# Patient Record
Sex: Female | Born: 1995 | Race: White | Hispanic: No | Marital: Single | Smoking: Never smoker
Health system: Southern US, Community
[De-identification: ages and names within clinical notes are randomized; demographics above are authoritative.]

## PROBLEM LIST (undated history)

## (undated) DIAGNOSIS — D3910 Neoplasm of uncertain behavior of unspecified ovary: Secondary | ICD-10-CM

## (undated) DIAGNOSIS — N800 Endometriosis of uterus: Secondary | ICD-10-CM

## (undated) DIAGNOSIS — N946 Dysmenorrhea, unspecified: Secondary | ICD-10-CM

## (undated) DIAGNOSIS — A6000 Herpesviral infection of urogenital system, unspecified: Secondary | ICD-10-CM

## (undated) DIAGNOSIS — F32A Depression, unspecified: Secondary | ICD-10-CM

## (undated) DIAGNOSIS — R519 Headache, unspecified: Secondary | ICD-10-CM

## (undated) DIAGNOSIS — N8003 Adenomyosis of the uterus: Secondary | ICD-10-CM

## (undated) DIAGNOSIS — K589 Irritable bowel syndrome without diarrhea: Secondary | ICD-10-CM

## (undated) HISTORY — DX: Irritable bowel syndrome, unspecified: K58.9

## (undated) HISTORY — DX: Endometriosis of uterus: N80.0

## (undated) HISTORY — PX: GUM SURGERY: SHX658

## (undated) HISTORY — DX: Depression, unspecified: F32.A

## (undated) HISTORY — DX: Dysmenorrhea, unspecified: N94.6

## (undated) HISTORY — DX: Adenomyosis of the uterus: N80.03

---

## 2014-06-06 DIAGNOSIS — N944 Primary dysmenorrhea: Secondary | ICD-10-CM | POA: Insufficient documentation

## 2017-09-24 DIAGNOSIS — K580 Irritable bowel syndrome with diarrhea: Secondary | ICD-10-CM | POA: Insufficient documentation

## 2019-05-23 DIAGNOSIS — G8929 Other chronic pain: Secondary | ICD-10-CM | POA: Insufficient documentation

## 2019-05-23 DIAGNOSIS — F339 Major depressive disorder, recurrent, unspecified: Secondary | ICD-10-CM | POA: Insufficient documentation

## 2019-05-23 DIAGNOSIS — R102 Pelvic and perineal pain: Secondary | ICD-10-CM | POA: Insufficient documentation

## 2019-07-12 DIAGNOSIS — M7918 Myalgia, other site: Secondary | ICD-10-CM | POA: Insufficient documentation

## 2019-11-15 HISTORY — PX: LAPAROSCOPIC ENDOMETRIOSIS FULGURATION: SUR769

## 2020-02-12 HISTORY — PX: LAPAROSCOPY: SHX197

## 2020-07-28 DIAGNOSIS — C569 Malignant neoplasm of unspecified ovary: Secondary | ICD-10-CM | POA: Insufficient documentation

## 2021-05-31 ENCOUNTER — Other Ambulatory Visit: Payer: Self-pay

## 2021-05-31 ENCOUNTER — Telehealth: Payer: Self-pay

## 2021-05-31 ENCOUNTER — Other Ambulatory Visit: Payer: Self-pay | Admitting: Gynecologic Oncology

## 2021-05-31 DIAGNOSIS — C569 Malignant neoplasm of unspecified ovary: Secondary | ICD-10-CM

## 2021-05-31 NOTE — Telephone Encounter (Signed)
[facility] is not in network for new insurance [employer]. Ms Krista Mcintyre is going to e-mail insurance card info to [facility] this evening. The email will be checked in the am as prior auth is needed for [place] scheduled on 06-19-21 at 1330. CA-125 to be drawn on 06-19-21 as well for visit 06-26-21 with Dr. Berline Lopes.

## 2021-05-31 NOTE — Progress Notes (Signed)
See Care Everywhere for the last office visit with Dr. Alycia Rossetti. Plan is for [place] and CA 125 checks every six months.

## 2021-06-01 ENCOUNTER — Telehealth: Payer: Self-pay

## 2021-06-01 NOTE — Telephone Encounter (Signed)
Left message for patient letting her know that her insurance information has been received and saved to her chart. Instructed to call with any questions.

## 2021-06-19 ENCOUNTER — Other Ambulatory Visit: Payer: Self-pay

## 2021-06-19 ENCOUNTER — Inpatient Hospital Stay: Payer: 59 | Attending: Gynecologic Oncology

## 2021-06-19 ENCOUNTER — Ambulatory Visit (HOSPITAL_COMMUNITY)
Admission: RE | Admit: 2021-06-19 | Discharge: 2021-06-19 | Disposition: A | Discharge status: Home / Self Care | Payer: 59 | Source: Ambulatory Visit | Attending: Gynecologic Oncology | Admitting: Gynecologic Oncology

## 2021-06-19 DIAGNOSIS — R102 Pelvic and perineal pain: Secondary | ICD-10-CM | POA: Insufficient documentation

## 2021-06-19 DIAGNOSIS — N800 Endometriosis of uterus: Secondary | ICD-10-CM | POA: Insufficient documentation

## 2021-06-19 DIAGNOSIS — C569 Malignant neoplasm of unspecified ovary: Secondary | ICD-10-CM | POA: Insufficient documentation

## 2021-06-19 DIAGNOSIS — R110 Nausea: Secondary | ICD-10-CM | POA: Insufficient documentation

## 2021-06-19 DIAGNOSIS — D3910 Neoplasm of uncertain behavior of unspecified ovary: Secondary | ICD-10-CM | POA: Insufficient documentation

## 2021-06-20 LAB — CA 125: Cancer Antigen (CA) 125: 15.6 U/mL (ref 0.0–38.1)

## 2021-06-25 ENCOUNTER — Encounter: Payer: Self-pay | Admitting: Gynecologic Oncology

## 2021-06-25 DIAGNOSIS — N8003 Adenomyosis of the uterus: Secondary | ICD-10-CM | POA: Insufficient documentation

## 2021-06-25 DIAGNOSIS — N800 Endometriosis of uterus: Secondary | ICD-10-CM | POA: Insufficient documentation

## 2021-06-25 NOTE — Progress Notes (Signed)
GYNECOLOGIC ONCOLOGY NEW PATIENT CONSULTATION   Patient Name: Krista Mcintyre  Patient Age: 25 y.o. Date of Service: 06/26/21 Referring Provider: No referring provider defined for this encounter.   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   The patient and I reviewed prior pathology details from her surgery and diagnosis of serous borderline tumors. She has been previously counseled about surveillance recommendations in the setting of borderline tumors although this testing (ultrasound and CA-125) is not particularly sensitive or specific. She had a recent pelvic ultrasound without significant/abnormal findings. CA-125 was 15 (did not have this drawn around time of initial diagnosis, first level drawn was at [facility] visit last fall). I recommend continued surveillance visits every 6 months to include symptom review and pelvic exam. We also discussed recommendations in terms of definitive surgery usually recommended after child bearing is complete and to be considered starting between ages of 99-40.  In terms of her pelvic pain, I suspect these are related to adenomyosis (findings suspicious for this diagnosis on previous MRI). We discussed that this diagnosis can only be definitely established on pathologic review after hysterectomy. She was previously managed by an adenomyosis specialist. Given significant symptoms, even on medical management, the patient has previously voiced interest in hysterectomy. It is clear to me that she has spent a lot of time thinking about her fertility plans. We discussed risk of surgery at a young age (in terms of regret), but I feel that it would be reasonable to offer definitive surgery with total hysterectomy in the near future. Plan would be to leave ovaries in situ as  long as normal in appearance. We will address this at her visit in 6 months and likely plan for surgery within the upcoming year.   I will see her in 6 months with a ultrasound and CA-125 on the same day give her 2.5 hour drive.   65 minutes of total time was spent for this patient encounter, including preparation, face-to-face counseling with the patient and coordination of care, and documentation of the encounter.  Krista Pinch, MD  Division of Gynecologic Oncology  Department of Obstetrics and Gynecology  [facility]  ___________________________________________  Chief Complaint: Chief Complaint  Patient presents with   Serous cystadenoma, borderline malignancy, unspecified late    History of Present Illness:  Krista Mcintyre is a 25 y.o. y.o. female who is seen in consultation at the request of No ref. 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  Her borderline tumor is noted below: Early 2021:  initially seen by the [facility] service at [facility] [place] for chronic pelvic pain. Management with continuous OCP was unhelpful as was duloxetine. Endometriosis was suspected and she was consented for laparoscopic fulguration and IUD placement.  12/03/2019 underwent operative laparoscopy for excision of endometriosis and placement of mIUD with MIS, biopsy of adhesion and pelvic side wall implant. Findings from surgery - "3cm area over the right pelvic sidewall with a vesicular plaque, visibly suspicious for endometriosis, just medial to the right ureter. 1cm area of similar appearing plaque over the left ureter. Bilateral ureters visualized retroperitoneally and lateralized from the operative sites. Otherwise normal posterior cul de sac. All visible disease excised."  Final path returned serous borderline tumor.  Referred to gyn  oncology. 01/11/2020 case discussed in tumor  board recommendation with CT abdomen pelvis and diagnostic laparoscopy with washing and peritoneal biopsy with omentectomy was recommended. 01/19/2020 CT scan revealed no visible disease or enlarged lymph nodes. 02/2020: Reoperation with Dr. Cecil Mcintyre ([place]) for pelvic washings, omentectomy, and peritoneal stripping with biopsies. Cytology was positive for neoplastic cells but all other biopsies were negative.  04/2020: seen at [facility] for follow-up. Moved to [place], [place] 07/2020: Established care at [facility]. CA-125 was 8.  The patient reports intermittent (not daily) abdominal and pelvic pain that worsened this summer and has become more frequent. At times this pain is rather debilitating and makes it difficult for her to do daily activities. She describes it was stabbing pain that comes and goes, located in her central deep pelvis with occasional radiation to her left hip and down her left leg and sometimes is associated with low back pain. She uses Advil and acetaminophen prn for pain which she is not sure provides any relief. Heat therapy helps some as does Teacher, adult education. She is amenorheic with the Mirena IUD and on OCPs. She denies any early satiety or bloating. She has occasional nausea when she has pelvic pain (ave 1x week - has anti-emetic that she can use when she gets symptoms). She endorses a good appetite, denies emesis. Has some pain with bowel movements (occasional), denies any bleeding. Bowel function is otherwise normal. She denies urinary symptoms. Has done pelvic floor PT in the past, which she enjoyed but did not find very helpful in terms of symptoms.  The patient lives alone. She works at [employer].  She is not interested in future fertility. She has been thinking about hysterectomy since the age of 48. She is single and not interested in long-term partnership.  PAST MEDICAL HISTORY:  Past Medical History:  Diagnosis Date   Adenomyosis    Depression    Dysmenorrhea    IBS  (irritable bowel syndrome)      PAST SURGICAL HISTORY:  Past Surgical History:  Procedure Laterality Date   LAPAROSCOPIC ENDOMETRIOSIS FULGURATION  11/2019   LAPAROSCOPY  02/2020   omentectomy, peritoneal biopsies    OB/GYN HISTORY:  OB History  Gravida Para Term Preterm AB Living  0 0 0 0 0 0  SAB IAB Ectopic Multiple Live Births  0 0 0 0 0    No LMP recorded.  Age at menarche: 89  Age at menopause: n/a Hx of HRT: n/a Hx of STDs: Denies Last pap: 09/2019 - negative History of abnormal pap smears: Denies Birth control history: OCP x 6 years, Mirena + COC at present  MEDICATIONS: Outpatient Encounter Medications as of 06/26/2021  Medication Sig   buPROPion (WELLBUTRIN XL) 150 MG 24 hr tablet Take 1 tablet by mouth every morning.   Drospirenone-Ethinyl Estradiol-Levomefol (SAFYRAL) 3-0.03-0.451 MG tablet Take by mouth. 1 Tablet by mouth as directed. Patient to take active pills only. Patient needs 63 day supply.   nortriptyline (PAMELOR) 10 MG capsule Take 10 mg by mouth at bedtime.   ondansetron (ZOFRAN-ODT) 4 MG disintegrating tablet Take 1 tablet by mouth every 8 (eight) hours as needed.   amoxicillin (AMOXIL) 875 MG tablet Take 1 tablet by mouth in the morning and at bedtime. (Patient not taking: Reported on 06/26/2021)   HYDROcodone-acetaminophen (NORCO/VICODIN) 5-325 MG tablet Take 1 tablet by mouth every 6 (six) hours as needed. (Patient not taking: Reported on 06/26/2021)   predniSONE (DELTASONE) 10 MG tablet Take 10 mg by mouth  daily as needed. (Patient not taking: Reported on 06/26/2021)   [DISCONTINUED] Drospirenone-Ethinyl Estradiol-Levomefol (SAFYRAL) 3-0.03-0.451 MG tablet Take 1 tablet by mouth as directed.   No facility-administered encounter medications on file as of 06/26/2021.    ALLERGIES:  No Known Allergies   FAMILY HISTORY:  Family History  Problem Relation Age of Onset   Breast cancer Maternal Grandmother    Kidney cancer Maternal Grandfather     Cervical cancer Paternal Grandmother      SOCIAL HISTORY:  Social Connections: Not on file    REVIEW OF SYSTEMS:  Pertinent positives as per HPI Denies appetite changes, fevers, chills, fatigue, unexplained weight changes. Denies hearing loss, neck lumps or masses, mouth sores, ringing in ears or voice changes. Denies cough or wheezing.  Denies shortness of breath. Denies chest pain or palpitations. Denies leg swelling. Denies abdominal distention, pain, blood in stools, constipation, diarrhea, vomiting, or early satiety. Denies pain with intercourse, dysuria, frequency, hematuria or incontinence. Denies hot flashes or vaginal discharge.   Denies joint pain, back pain or muscle pain/cramps. Denies itching, rash, or wounds. Denies dizziness, headaches, numbness or seizures. Denies swollen lymph nodes or glands, denies easy bruising or bleeding. Denies anxiety, depression, confusion, or decreased concentration.  Physical Exam:  Vital Signs for this encounter:  Blood pressure 119/72, pulse 100, temperature (!) 97.2 F (36.2 C), resp. rate 18, weight 129 lb 3.2 oz (58.6 kg), SpO2 99 %. There is no height or weight on file to calculate BMI. General: Alert, oriented, no acute distress.  HEENT: Normocephalic, atraumatic. Sclera anicteric.  Chest: Clear to auscultation bilaterally. No wheezes, rhonchi, or rales. Cardiovascular: Regular rate and rhythm, no murmurs, rubs, or gallops.  Abdomen: Normoactive bowel sounds. Soft, nondistended, nontender to palpation. No masses or hepatosplenomegaly appreciated. No palpable fluid wave. Well healed incisions. Extremities: Grossly normal range of motion. Warm, well perfused. No edema bilaterally.  Skin: No rashes or lesions.  Lymphatics: No cervical, supraclavicular, or inguinal adenopathy.  GU:  Normal external female genitalia.  No lesions. No discharge or bleeding.             Bladder/urethra:  No lesions or masses, well supported bladder              Vagina: Well rugated, no lesions or masses.             Cervix: Normal appearing, no lesions. IUD strings visualized.             Uterus: Small, mobile, no parametrial involvement or nodularity.             Adnexa: No masses appreciated.  Rectal: No nodularity or masses.  LABORATORY AND RADIOLOGIC DATA:  Outside medical records were reviewed to synthesize the above history, along with the history and physical obtained during the visit.   No results found for: WBC, HGB, HCT, PLT, GLUCOSE, CHOL, TRIG, HDL, LDLDIRECT, LDLCALC, ALT, AST, NA, K, CL, CREATININE, BUN, CO2, TSH, PSA, INR, GLUF, HGBA1C, MICROALBUR  Component 07/31/2020                       06/19/21     CA 125 8                                       15.6   Pelvic ultrasound 06/19/21: IMPRESSION: 1. Normal sonographic appearance of the ovaries. No adnexal mass or free  fluid. 2. Normal uterus and endometrium. 3. IUD in appropriate position within the endometrial cavity.

## 2021-06-26 ENCOUNTER — Other Ambulatory Visit: Payer: Self-pay

## 2021-06-26 ENCOUNTER — Encounter: Payer: Self-pay | Admitting: Gynecologic Oncology

## 2021-06-26 ENCOUNTER — Inpatient Hospital Stay (HOSPITAL_BASED_OUTPATIENT_CLINIC_OR_DEPARTMENT_OTHER): Payer: 59 | Admitting: Gynecologic Oncology

## 2021-06-26 VITALS — BP 119/72 | HR 100 | Temp 97.2°F | Resp 18 | Wt 129.2 lb

## 2021-06-26 DIAGNOSIS — D3910 Neoplasm of uncertain behavior of unspecified ovary: Secondary | ICD-10-CM | POA: Insufficient documentation

## 2021-06-26 DIAGNOSIS — N800 Endometriosis of uterus: Secondary | ICD-10-CM | POA: Diagnosis not present

## 2021-06-26 DIAGNOSIS — R102 Pelvic and perineal pain: Secondary | ICD-10-CM | POA: Diagnosis not present

## 2021-06-26 DIAGNOSIS — D484 Neoplasm of uncertain behavior of peritoneum: Secondary | ICD-10-CM | POA: Insufficient documentation

## 2021-06-26 DIAGNOSIS — N8003 Adenomyosis of the uterus: Secondary | ICD-10-CM

## 2021-06-26 DIAGNOSIS — R110 Nausea: Secondary | ICD-10-CM | POA: Diagnosis not present

## 2021-06-26 DIAGNOSIS — C569 Malignant neoplasm of unspecified ovary: Secondary | ICD-10-CM

## 2021-06-26 NOTE — Patient Instructions (Signed)
It was a pleasure meeting you today.  I do not see or feel anything on my exam that is concerning for recurrence of your borderline tumor.  I had like to see you back in 6 months and we will plan for CA125 and pelvic ultrasound on the same day with a visit.  If your symptoms worsen between now and then, please reach out.  Otherwise, we will plan to discuss definitive surgery with a hysterectomy at that visit.

## 2021-08-29 ENCOUNTER — Encounter: Payer: Self-pay | Admitting: Gynecologic Oncology

## 2021-09-04 ENCOUNTER — Telehealth: Payer: Self-pay | Admitting: *Deleted

## 2021-09-04 NOTE — Telephone Encounter (Signed)
Called and left the patient a message to call the office back to schedule an appt to discuss surgery in mid January

## 2021-09-17 NOTE — Telephone Encounter (Signed)
Spoke with the patient for a follow up with Dr Berline Lopes on 1/16

## 2021-10-23 ENCOUNTER — Encounter: Payer: Self-pay | Admitting: Gynecologic Oncology

## 2021-10-29 ENCOUNTER — Inpatient Hospital Stay: Payer: 59 | Attending: Gynecologic Oncology | Admitting: Gynecologic Oncology

## 2021-10-29 ENCOUNTER — Other Ambulatory Visit: Payer: Self-pay

## 2021-10-29 ENCOUNTER — Encounter: Payer: Self-pay | Admitting: Gynecologic Oncology

## 2021-10-29 VITALS — BP 126/72 | HR 105 | Temp 98.2°F | Resp 16 | Ht 64.57 in | Wt 133.4 lb

## 2021-10-29 DIAGNOSIS — R102 Pelvic and perineal pain: Secondary | ICD-10-CM | POA: Insufficient documentation

## 2021-10-29 DIAGNOSIS — N8003 Adenomyosis of the uterus: Secondary | ICD-10-CM | POA: Insufficient documentation

## 2021-10-29 DIAGNOSIS — N946 Dysmenorrhea, unspecified: Secondary | ICD-10-CM | POA: Insufficient documentation

## 2021-10-29 DIAGNOSIS — C569 Malignant neoplasm of unspecified ovary: Secondary | ICD-10-CM

## 2021-10-29 DIAGNOSIS — F32A Depression, unspecified: Secondary | ICD-10-CM | POA: Diagnosis not present

## 2021-10-29 DIAGNOSIS — Z79890 Hormone replacement therapy (postmenopausal): Secondary | ICD-10-CM | POA: Diagnosis not present

## 2021-10-29 DIAGNOSIS — Z79899 Other long term (current) drug therapy: Secondary | ICD-10-CM | POA: Insufficient documentation

## 2021-10-29 DIAGNOSIS — D3910 Neoplasm of uncertain behavior of unspecified ovary: Secondary | ICD-10-CM | POA: Diagnosis present

## 2021-10-29 DIAGNOSIS — K589 Irritable bowel syndrome without diarrhea: Secondary | ICD-10-CM | POA: Diagnosis not present

## 2021-10-29 NOTE — Patient Instructions (Addendum)
We will schedule surgery for December 26, 2021. Please let us know if we need to adjust the date after speaking with your family. We will have you come back to the office closer to the date to have a pre-op appointment with Keifer Habib NP to discuss the surgery in detail including instructions before and after. You may also receive a phone call from the hospital to arrange for a pre-op appointment there as well.

## 2021-10-29 NOTE — H&P (View-Only) (Signed)
Gynecologic Oncology Return Clinic Visit  10/29/21  Reason for Visit: surveillance visit in the setting of borderline tumor  Treatment History: Early 2021:  initially seen by the [facility] service at [facility] [place] for chronic pelvic pain. Management with continuous OCP was unhelpful as was duloxetine. Endometriosis was suspected and she was consented for laparoscopic fulguration and IUD placement.  12/03/2019 underwent operative laparoscopy for excision of endometriosis and placement of mIUD with MIS, biopsy of adhesion and pelvic side wall implant. Findings from surgery - "3cm area over the right pelvic sidewall with a vesicular plaque, visibly suspicious for endometriosis, just medial to the right ureter. 1cm area of similar appearing plaque over the left ureter. Bilateral ureters visualized retroperitoneally and lateralized from the operative sites. Otherwise normal posterior cul de sac. All visible disease excised."  Final path returned serous borderline tumor.  Referred to gyn oncology. 01/11/2020 case discussed in tumor board recommendation with CT abdomen pelvis and diagnostic laparoscopy with washing and peritoneal biopsy with omentectomy was recommended. 01/19/2020 CT scan revealed no visible disease or enlarged lymph nodes. 02/2020: Reoperation with Dr. Cecil Cranker ([place]) for pelvic washings, omentectomy, and peritoneal stripping with biopsies. Cytology was positive for neoplastic cells but all other biopsies were negative.  04/2020: seen at [facility] for follow-up. Moved to [place], [place] 07/2020: Established care at [facility]. CA-125 was 8.  Interval History: Patient presents today for follow-up.  She is overall doing well.  Notes some increase in pelvic pain, which is happening for at least 5 days a month now.  During this time, she is in bed without any significant relief.  She describes the pain as acute, stabbing, with radiation down her left leg and sometimes lower back pain.  She also has nausea now more  frequently, that seems to be related to episodes of pain.  She had 1 episode of emesis with nausea several weeks ago, otherwise denies emesis.  She denies any vaginal bleeding or discharge.  Reports normal bowel function.  Past Medical/Surgical History: Past Medical History:  Diagnosis Date   Adenomyosis    Depression    Dysmenorrhea    IBS (irritable bowel syndrome)     Past Surgical History:  Procedure Laterality Date   LAPAROSCOPIC ENDOMETRIOSIS FULGURATION  11/2019   LAPAROSCOPY  02/2020   omentectomy, peritoneal biopsies    Family History  Problem Relation Age of Onset   Breast cancer Maternal Grandmother    Kidney cancer Maternal Grandfather    Cervical cancer Paternal Grandmother     Social History   Socioeconomic History   Marital status: Single    Spouse name: Not on file   Number of children: Not on file   Years of education: Not on file   Highest education level: Not on file  Occupational History   Not on file  Tobacco Use   Smoking status: Never   Smokeless tobacco: Never  Vaping Use   Vaping Use: Never used  Substance and Sexual Activity   Alcohol use: Not on file   Drug use: Never   Sexual activity: Not on file  Other Topics Concern   Not on file  Social History Narrative   Not on file   Social Determinants of Health   Financial Resource Strain: Not on file  Food Insecurity: Not on file  Transportation Needs: Not on file  Physical Activity: Not on file  Stress: Not on file  Social Connections: Not on file    Current Medications:  Current Outpatient Medications:  buPROPion (WELLBUTRIN XL) 150 MG 24 hr tablet, Take 1 tablet by mouth every morning., Disp: , Rfl:    Cholecalciferol 125 MCG (5000 UT) TABS, Take 125 mcg by mouth daily., Disp: , Rfl:    Drospirenone-Ethinyl Estradiol-Levomefol (SAFYRAL) 3-0.03-0.451 MG tablet, Take by mouth. 1 Tablet by mouth as directed. Patient to take active pills only. Patient needs 63 day supply., Disp:  , Rfl:    nortriptyline (PAMELOR) 10 MG capsule, Take 10 mg by mouth at bedtime., Disp: , Rfl:    ondansetron (ZOFRAN-ODT) 4 MG disintegrating tablet, Take 1 tablet by mouth every 8 (eight) hours as needed., Disp: , Rfl:    valACYclovir (VALTREX) 500 MG tablet, Take 1 tablet by mouth daily., Disp: , Rfl:    HYDROcodone-acetaminophen (NORCO/VICODIN) 5-325 MG tablet, Take 1 tablet by mouth every 6 (six) hours as needed. (Patient not taking: Reported on 06/26/2021), Disp: , Rfl:    predniSONE (DELTASONE) 10 MG tablet, Take 10 mg by mouth daily as needed. (Patient not taking: Reported on 06/26/2021), Disp: , Rfl:   Review of Systems: Denies appetite changes, fevers, chills, fatigue, unexplained weight changes. Denies hearing loss, neck lumps or masses, mouth sores, ringing in ears or voice changes. Denies cough or wheezing.  Denies shortness of breath. Denies chest pain or palpitations. Denies leg swelling. Denies abdominal distention, pain, blood in stools, constipation, diarrhea, nausea, vomiting, or early satiety. Denies pain with intercourse, dysuria, frequency, hematuria or incontinence. Denies hot flashes, pelvic pain, vaginal bleeding or vaginal discharge.   Denies joint pain, back pain or muscle pain/cramps. Denies itching, rash, or wounds. Denies dizziness, headaches, numbness or seizures. Denies swollen lymph nodes or glands, denies easy bruising or bleeding. Denies anxiety, depression, confusion, or decreased concentration.  Physical Exam: BP 126/72 (BP Location: Left Arm, Patient Position: Sitting)    Pulse (!) 105    Temp 98.2 F (36.8 C) (Oral)    Resp 16    Ht 5' 4.57" (1.64 m)    Wt 133 lb 6.4 oz (60.5 kg)    SpO2 99%    BMI 22.50 kg/m  General: Alert, oriented, no acute distress. HEENT: Normocephalic, atraumatic, sclera anicteric. Chest: Unlabored breathing on room air.  Laboratory & Radiologic Studies: Pelvic ultrasound 06/19/21: 1. Normal sonographic appearance of the  ovaries. No adnexal mass or free fluid. 2. Normal uterus and endometrium. 3. IUD in appropriate position within the endometrial cavity.  06/19/21: Cancer Antigen (CA) 125 0.0 - 38.1 U/mL 15.6   07/21/20: CA 125 0 - 35 U/mL 8     Assessment & Plan: Krista Mcintyre is a 26 y.o. 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    The patient continues to have symptoms related to pelvic pain despite cessation of menses. She have previously discussed hysterectomy (as she has with other medical providers). She clearly expresses reasons that she is ready to move forward with definitive hysterectomy. We discussed risk factors for regret with sterilization procedures including younger age and being partnered.   She is wanting to move forward with total hysterectomy in the next 3-4 months. She has several weeks where her parents would be able to come help out around the time of surgery.   Plan will continue to be leaving ovaries in situ if they look normal but removal of bilateral tubes.   We will continue with CA-125, ultrasound, and exams every 6 months for surveillance until completion surgery (with removal of bilateral  ovaries) at a later age.   She will return closer to date of surgery for a pre-operative visit.  The risks of surgery were discussed in detail and she understands these to include infection; wound separation; hernia; vaginal cuff separation, injury to adjacent organs such as bowel, bladder, blood vessels, ureters and nerves; bleeding which may require blood transfusion; anesthesia risk; thromboembolic events; possible death; unforeseen complications; possible need for re-exploration; medical complications such as heart attack, stroke, pleural effusion and pneumonia; and, if full lymphadenectomy is performed the risk of lymphedema and lymphocyst. The patient will receive DVT and antibiotic prophylaxis  as indicated. She voiced a clear understanding. She had the opportunity to ask questions.   32 minutes of total time was spent for this patient encounter, including preparation, face-to-face counseling with the patient and coordination of care, and documentation of the encounter.  Jeral Pinch, MD  Division of Gynecologic Oncology  Department of Obstetrics and Gynecology  [facility]

## 2021-10-29 NOTE — Progress Notes (Signed)
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 Management with continuous OCP was unhelpful as was duloxetine. Endometriosis was suspected and she was consented for laparoscopic fulguration and IUD placement.  12/03/2019 underwent operative laparoscopy for excision of endometriosis and placement of mIUD with MIS, biopsy of adhesion and pelvic side wall implant. Findings from surgery - "3cm area over the right pelvic sidewall with a vesicular plaque, visibly suspicious for endometriosis, just medial to the right ureter. 1cm area of similar appearing plaque over the left ureter. Bilateral ureters visualized retroperitoneally and lateralized from the operative sites. Otherwise normal posterior cul de sac. All visible disease excised."  Final path returned serous borderline tumor.  Referred to gyn oncology. 01/11/2020 case discussed in tumor board recommendation with CT abdomen pelvis and diagnostic laparoscopy with washing and peritoneal biopsy with omentectomy was recommended. 01/19/2020 CT scan revealed no visible disease or enlarged lymph nodes. 02/2020: Reoperation with Dr. Cecil Cranker ([place]) for pelvic washings, omentectomy, and peritoneal stripping with biopsies. Cytology was positive for neoplastic cells but all other biopsies were negative.  04/2020: seen at [facility] for follow-up. Moved to [place], [place] 07/2020: Established care at [facility]. CA-125 was 8.  Interval History: Patient presents today for follow-up.  She is overall doing well.  Notes some increase in pelvic pain, which is happening for at least 5 days a month now.  During this time, she is in bed without any significant relief.  She describes the pain as acute, stabbing, with radiation down her left leg and sometimes lower back pain.  She also has nausea now more  frequently, that seems to be related to episodes of pain.  She had 1 episode of emesis with nausea several weeks ago, otherwise denies emesis.  She denies any vaginal bleeding or discharge.  Reports normal bowel function.  Past Medical/Surgical History: Past Medical History:  Diagnosis Date   Adenomyosis    Depression    Dysmenorrhea    IBS (irritable bowel syndrome)     Past Surgical History:  Procedure Laterality Date   LAPAROSCOPIC ENDOMETRIOSIS FULGURATION  11/2019   LAPAROSCOPY  02/2020   omentectomy, peritoneal biopsies    Family History  Problem Relation Age of Onset   Breast cancer Maternal Grandmother    Kidney cancer Maternal Grandfather    Cervical cancer Paternal Grandmother     Social History   Socioeconomic History   Marital status: Single    Spouse name: Not on file   Number of children: Not on file   Years of education: Not on file   Highest education level: Not on file  Occupational History   Not on file  Tobacco Use   Smoking status: Never   Smokeless tobacco: Never  Vaping Use   Vaping Use: Never used  Substance and Sexual Activity   Alcohol use: Not on file   Drug use: Never   Sexual activity: Not on file  Other Topics Concern   Not on file  Social History Narrative   Not on file   Social Determinants of Health   Financial Resource Strain: Not on file  Food Insecurity: Not on file  Transportation Needs: Not on file  Physical Activity: Not on file  Stress: Not on file  Social Connections: Not on file    Current Medications:  Current Outpatient Medications:  buPROPion (WELLBUTRIN XL) 150 MG 24 hr tablet, Take 1 tablet by mouth every morning., Disp: , Rfl:    Cholecalciferol 125 MCG (5000 UT) TABS, Take 125 mcg by mouth daily., Disp: , Rfl:    Drospirenone-Ethinyl Estradiol-Levomefol (SAFYRAL) 3-0.03-0.451 MG tablet, Take by mouth. 1 Tablet by mouth as directed. Patient to take active pills only. Patient needs 63 day supply., Disp:  , Rfl:    nortriptyline (PAMELOR) 10 MG capsule, Take 10 mg by mouth at bedtime., Disp: , Rfl:    ondansetron (ZOFRAN-ODT) 4 MG disintegrating tablet, Take 1 tablet by mouth every 8 (eight) hours as needed., Disp: , Rfl:    valACYclovir (VALTREX) 500 MG tablet, Take 1 tablet by mouth daily., Disp: , Rfl:    HYDROcodone-acetaminophen (NORCO/VICODIN) 5-325 MG tablet, Take 1 tablet by mouth every 6 (six) hours as needed. (Patient not taking: Reported on 06/26/2021), Disp: , Rfl:    predniSONE (DELTASONE) 10 MG tablet, Take 10 mg by mouth daily as needed. (Patient not taking: Reported on 06/26/2021), Disp: , Rfl:   Review of Systems: Denies appetite changes, fevers, chills, fatigue, unexplained weight changes. Denies hearing loss, neck lumps or masses, mouth sores, ringing in ears or voice changes. Denies cough or wheezing.  Denies shortness of breath. Denies chest pain or palpitations. Denies leg swelling. Denies abdominal distention, pain, blood in stools, constipation, diarrhea, nausea, vomiting, or early satiety. Denies pain with intercourse, dysuria, frequency, hematuria or incontinence. Denies hot flashes, pelvic pain, vaginal bleeding or vaginal discharge.   Denies joint pain, back pain or muscle pain/cramps. Denies itching, rash, or wounds. Denies dizziness, headaches, numbness or seizures. Denies swollen lymph nodes or glands, denies easy bruising or bleeding. Denies anxiety, depression, confusion, or decreased concentration.  Physical Exam: BP 126/72 (BP Location: Left Arm, Patient Position: Sitting)    Pulse (!) 105    Temp 98.2 F (36.8 C) (Oral)    Resp 16    Ht 5' 4.57" (1.64 m)    Wt 133 lb 6.4 oz (60.5 kg)    SpO2 99%    BMI 22.50 kg/m  General: Alert, oriented, no acute distress. HEENT: Normocephalic, atraumatic, sclera anicteric. Chest: Unlabored breathing on room air.  Laboratory & Radiologic Studies: Pelvic ultrasound 06/19/21: 1. Normal sonographic appearance of the  ovaries. No adnexal mass or free fluid. 2. Normal uterus and endometrium. 3. IUD in appropriate position within the endometrial cavity.  06/19/21: Cancer Antigen (CA) 125 0.0 - 38.1 U/mL 15.6   07/21/20: CA 125 0 - 35 U/mL 8     Assessment & Plan: Krista Mcintyre is a 26 y.o. 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    The patient continues to have symptoms related to pelvic pain despite cessation of menses. She have previously discussed hysterectomy (as she has with other medical providers). She clearly expresses reasons that she is ready to move forward with definitive hysterectomy. We discussed risk factors for regret with sterilization procedures including younger age and being partnered.   She is wanting to move forward with total hysterectomy in the next 3-4 months. She has several weeks where her parents would be able to come help out around the time of surgery.   Plan will continue to be leaving ovaries in situ if they look normal but removal of bilateral tubes.   We will continue with CA-125, ultrasound, and exams every 6 months for surveillance until completion surgery (with removal of bilateral  ovaries) at a later age.   She will return closer to date of surgery for a pre-operative visit.  The risks of surgery were discussed in detail and she understands these to include infection; wound separation; hernia; vaginal cuff separation, injury to adjacent organs such as bowel, bladder, blood vessels, ureters and nerves; bleeding which may require blood transfusion; anesthesia risk; thromboembolic events; possible death; unforeseen complications; possible need for re-exploration; medical complications such as heart attack, stroke, pleural effusion and pneumonia; and, if full lymphadenectomy is performed the risk of lymphedema and lymphocyst. The patient will receive DVT and antibiotic prophylaxis  as indicated. She voiced a clear understanding. She had the opportunity to ask questions.   32 minutes of total time was spent for this patient encounter, including preparation, face-to-face counseling with the patient and coordination of care, and documentation of the encounter.  Jeral Pinch, MD  Division of Gynecologic Oncology  Department of Obstetrics and Gynecology  [facility]

## 2021-11-01 ENCOUNTER — Other Ambulatory Visit: Payer: Self-pay | Admitting: Gynecologic Oncology

## 2021-11-01 DIAGNOSIS — C569 Malignant neoplasm of unspecified ovary: Secondary | ICD-10-CM

## 2021-11-02 ENCOUNTER — Telehealth: Payer: Self-pay | Admitting: *Deleted

## 2021-11-02 NOTE — Telephone Encounter (Signed)
Rescheduled appts for pre op with [facility] and Pre Admissions from March to January 31st. Surgery moved up to 2/8

## 2021-11-09 NOTE — Patient Instructions (Addendum)
DUE TO COVID-19 ONLY ONE VISITOR IS ALLOWED TO COME WITH YOU AND STAY IN THE WAITING ROOM ONLY DURING PRE OP AND PROCEDURE.   **NO VISITORS ARE ALLOWED IN THE SHORT STAY AREA OR RECOVERY ROOM!!**   You are not required to quarantine, however you are required to wear a well-fitted mask when you are out and around people not in your household.  Hand Hygiene often Do NOT share personal items Notify your provider if you are in close contact with someone who has COVID or you develop fever 100.4 or greater, new onset of sneezing, cough, sore throat, shortness of breath or body aches.       Your procedure is scheduled on: Wednesday, 11-21-21   Report to [facility] Main  Entrance     Report to admitting at 8:45 AM   Call this number if you have problems the morning of surgery [phone]   Follow a light diet day before surgery (avoid gas producing foods)   Do not eat food :After Midnight.   May have liquids until 8:00 AM day of surgery  CLEAR LIQUID DIET  Foods Allowed                                                                     Foods Excluded  Water, Black Coffee (no milk/no creamer) and tea, regular and decaf                              liquids that you cannot  Plain Jell-O in any flavor  (No red)                         see through such as: Fruit ices (not with fruit pulp)                                 milk, soups, orange juice  Iced Popsicles (No red)                                    All solid food                             Apple juices Sports drinks like Gatorade (No red) Lightly seasoned clear broth or consume(fat free) Sugar     Complete one Ensure drink the morning of surgery at 8:00 AM the day of surgery.      The day of surgery:  Drink ONE (1) Pre-Surgery Clear Ensure the morning of surgery. Drink in one sitting. Do not sip.  This drink was given to you during your hospital  pre-op appointment visit. Nothing else to drink after completing the  Pre-Surgery Clear Ensure .          If you have questions, please contact your surgeons office.     Oral Hygiene is also important to reduce your risk of infection.  Remember - BRUSH YOUR TEETH THE MORNING OF SURGERY WITH YOUR REGULAR TOOTHPASTE   Do NOT smoke after Midnight   Take these medicines the morning of surgery with A SIP OF WATER: Bupropion, Ondansetron   Stop all vitamins and herbal supplements a week before surgery             You may not have any metal on your body including hair pins, jewelry, and body piercing             Do not wear make-up, lotions, powders, perfumes or deodorant  Do not wear nail polish including gel and S&S, artificial/acrylic nails, or any other type of covering on natural nails including finger and toenails. If you have artificial nails, gel coating, etc. that needs to be removed by a nail salon please have this removed prior to surgery or surgery may need to be canceled/ delayed if the surgeon/ anesthesia feels like they are unable to be safely monitored.   Do not shave  48 hours prior to surgery.   Do not bring valuables to the hospital. [place].   Contacts, dentures or bridgework may not be worn into surgery.   Patients discharged the day of surgery will not be allowed to drive home.   A responsible adult must remain with you for 24 hours after surgery.  Special Instructions: Bring a copy of your healthcare power of attorney and living will documents the day of surgery if you haven't scanned them in before.  Please read over the following fact sheets you were given: IF YOU HAVE QUESTIONS ABOUT YOUR PRE OP INSTRUCTIONS PLEASE CALL [place] - Preparing for Surgery Before surgery, you can play an important role.  Because skin is not sterile, your skin needs to be as free of germs as possible.  You can reduce the number of germs on your skin by washing  with CHG (chlorahexidine gluconate) soap before surgery.  CHG is an antiseptic cleaner which kills germs and bonds with the skin to continue killing germs even after washing. Please DO NOT use if you have an allergy to CHG or antibacterial soaps.  If your skin becomes reddened/irritated stop using the CHG and inform your nurse when you arrive at Short Stay. Do not shave (including legs and underarms) for at least 48 hours prior to the first CHG shower.  You may shave your face/neck.  Please follow these instructions carefully:  1.  Shower with CHG Soap the night before surgery and the  morning of surgery.  2.  If you choose to wash your hair, wash your hair first as usual with your normal  shampoo.  3.  After you shampoo, rinse your hair and body thoroughly to remove the shampoo.                             4.  Use CHG as you would any other liquid soap.  You can apply chg directly to the skin and wash.  Gently with a scrungie or clean washcloth.  5.  Apply the CHG Soap to your body ONLY FROM THE NECK DOWN.   Do   not use on face/ open                           Wound or open sores. Avoid contact with eyes, ears mouth and  genitals (private parts).                       Wash face,  Genitals (private parts) with your normal soap.             6.  Wash thoroughly, paying special attention to the area where your    surgery  will be performed.  7.  Thoroughly rinse your body with warm water from the neck down.  8.  DO NOT shower/wash with your normal soap after using and rinsing off the CHG Soap.                9.  Pat yourself dry with a clean towel.            10.  Wear clean pajamas.            11.  Place clean sheets on your bed the night of your first shower and do not  sleep with pets. Day of Surgery : Do not apply any lotions/deodorants the morning of surgery.  Please wear clean clothes to the hospital/surgery center.  FAILURE TO FOLLOW THESE INSTRUCTIONS MAY RESULT IN THE CANCELLATION OF YOUR  SURGERY  PATIENT SIGNATURE_________________________________  NURSE SIGNATURE__________________________________  ________________________________________________________________________  WHAT IS A BLOOD TRANSFUSION? Blood Transfusion Information  A transfusion is the replacement of blood or some of its parts. Blood is made up of multiple cells which provide different functions. Red blood cells carry oxygen and are used for blood loss replacement. White blood cells fight against infection. Platelets control bleeding. Plasma helps clot blood. Other blood products are available for specialized needs, such as hemophilia or other clotting disorders. BEFORE THE TRANSFUSION  Who gives blood for transfusions?  Healthy volunteers who are fully evaluated to make sure their blood is safe. This is blood bank blood. Transfusion therapy is the safest it has ever been in the practice of medicine. Before blood is taken from a donor, a complete history is taken to make sure that person has no history of diseases nor engages in risky social behavior (examples are intravenous drug use or sexual activity with multiple partners). The donor's travel history is screened to minimize risk of transmitting infections, such as malaria. The donated blood is tested for signs of infectious diseases, such as HIV and hepatitis. The blood is then tested to be sure it is compatible with you in order to minimize the chance of a transfusion reaction. If you or a relative donates blood, this is often done in anticipation of surgery and is not appropriate for emergency situations. It takes many days to process the donated blood. RISKS AND COMPLICATIONS Although transfusion therapy is very safe and saves many lives, the main dangers of transfusion include:  Getting an infectious disease. Developing a transfusion reaction. This is an allergic reaction to something in the blood you were given. Every precaution is taken to prevent  this. The decision to have a blood transfusion has been considered carefully by your caregiver before blood is given. Blood is not given unless the benefits outweigh the risks. AFTER THE TRANSFUSION Right after receiving a blood transfusion, you will usually feel much better and more energetic. This is especially true if your red blood cells have gotten low (anemic). The transfusion raises the level of the red blood cells which carry oxygen, and this usually causes an energy increase. The nurse administering the transfusion will monitor you carefully for complications. HOME CARE INSTRUCTIONS  No special instructions  are needed after a transfusion. You may find your energy is better. Speak with your caregiver about any limitations on activity for underlying diseases you may have. SEEK MEDICAL CARE IF:  Your condition is not improving after your transfusion. You develop redness or irritation at the intravenous (IV) site. SEEK IMMEDIATE MEDICAL CARE IF:  Any of the following symptoms occur over the next 12 hours: Shaking chills. You have a temperature by mouth above 102 F (38.9 C), not controlled by medicine. Chest, back, or muscle pain. People around you feel you are not acting correctly or are confused. Shortness of breath or difficulty breathing. Dizziness and fainting. You get a rash or develop hives. You have a decrease in urine output. Your urine turns a dark color or changes to pink, red, or brown. Any of the following symptoms occur over the next 10 days: You have a temperature by mouth above 102 F (38.9 C), not controlled by medicine. Shortness of breath. Weakness after normal activity. The white part of the eye turns yellow (jaundice). You have a decrease in the amount of urine or are urinating less often. Your urine turns a dark color or changes to pink, red, or brown. Document Released: 09/27/2000 Document Revised: 12/23/2011 Document Reviewed: 05/16/2008 [facility] Patient  Information 2014 [place], [place].  _______________________________________________________________________

## 2021-11-09 NOTE — Progress Notes (Addendum)
COVID swab appointment: N/A  COVID Vaccine Completed:  Yes x2 Date COVID Vaccine completed: 06-22-20 07-25-20 Has received booster:  Yes x2 08-24-20 09-12-21 COVID vaccine manufacturer:   Moderna     Date of COVID positive in last 90 days: No  PCP - [facility] Cardiologist - N/A  Chest x-ray - - N/A EKG - - N/A Stress Test - - N/A ECHO - - N/A Cardiac Cath -  Pacemaker/ICD device last checked: Spinal Cord Stimulator:  Sleep Study - - N/A CPAP -   Fasting Blood Sugar - - N/A Checks Blood Sugar _____ times a day  Blood Thinner Instructions:- N/A Aspirin Instructions: Last Dose:  Activity level:  Can go up a flight of stairs and perform activities of daily living without stopping and without symptoms of chest pain or shortness of breath.   Able to exercise without symptoms  Anesthesia review: - N/A  Patient denies shortness of breath, fever, cough and chest pain at PAT appointment  Patient verbalized understanding of instructions that were given to them at the PAT appointment. Patient was also instructed that they will need to review over the PAT instructions again at home before surgery.

## 2021-11-13 ENCOUNTER — Encounter (HOSPITAL_COMMUNITY): Payer: Self-pay

## 2021-11-13 ENCOUNTER — Inpatient Hospital Stay: Payer: 59 | Admitting: Gynecologic Oncology

## 2021-11-13 ENCOUNTER — Encounter (HOSPITAL_COMMUNITY)
Admission: RE | Admit: 2021-11-13 | Discharge: 2021-11-13 | Disposition: A | Discharge status: Home / Self Care | Payer: 59 | Source: Ambulatory Visit | Attending: Gynecologic Oncology | Admitting: Gynecologic Oncology

## 2021-11-13 ENCOUNTER — Telehealth: Payer: Self-pay

## 2021-11-13 ENCOUNTER — Other Ambulatory Visit: Payer: Self-pay

## 2021-11-13 HISTORY — DX: Neoplasm of uncertain behavior of unspecified ovary: D39.10

## 2021-11-13 HISTORY — DX: Herpesviral infection of urogenital system, unspecified: A60.00

## 2021-11-13 HISTORY — DX: Headache, unspecified: R51.9

## 2021-11-13 NOTE — Progress Notes (Deleted)
Patient here for a pre-operative appointment prior to her scheduled surgery on October 22, 2022. She is scheduled for robotic assisted laparoscopic unilateral salpingectomy versus unilateral salpingo-oophorectomy, possible bilateral salpingo-oophorectomy, possible total hysterectomy, possible staging. She has her pre-admission testing appointment on 10/21/22 at [place].  The surgery was discussed in detail.  See after visit summary for additional details. Visual aids used to discuss items related to surgery including sequential compression stockings, foley catheter, IV pump, multi-modal pain regimen including tylenol, photo of the surgical robot, female reproductive system to discuss surgery in detail.      Discussed post-op pain management in detail including the aspects of the enhanced recovery pathway.  Advised her that a new prescription would be sent in for tramadol and it is only to be used for after her upcoming surgery.  We discussed the use of tylenol post-op and to monitor for a maximum of 4,000 mg in a 24 hour period.  Also prescribed sennakot to be used after surgery and to hold if having loose stools.  Discussed bowel regimen in detail.     Discussed the use of SCDs and measures to take at home to prevent DVT including frequent mobility.  Reportable signs and symptoms of DVT discussed. Post-operative instructions discussed and expectations for after surgery. Incisional care discussed as well including reportable signs and symptoms including erythema, drainage, wound separation.     10 minutes spent with the patient and preparing information.  Verbalizing understanding of material discussed. No needs or concerns voiced at the end of the visit.   Advised patient to call for any needs.  Advised that her post-operative medications had been prescribed and could be picked up at any time.    This appointment is included in the global surgical bundle as pre-operative teaching and has no charge.    

## 2021-11-13 NOTE — Telephone Encounter (Signed)
Received call from Ms. Krista Mcintyre this morning. Patient reports she forgot about her appointments today and will need to reschedule.  Pre-op appointment with Joylene John, NP rescheduled for 11/14/21 at 12 pm. Patient also has a pre-admissions testing appointment today at [place] does not have availability to reschedule.  Appointment for 1pm today will be kept and conducted over the phone. An appointment for pre-op labs will be scheduled after the appointment with Joylene John, NP. Patient verbalized understanding and appreciative of assistance.

## 2021-11-14 ENCOUNTER — Inpatient Hospital Stay (HOSPITAL_BASED_OUTPATIENT_CLINIC_OR_DEPARTMENT_OTHER): Payer: 59 | Admitting: Gynecologic Oncology

## 2021-11-14 ENCOUNTER — Encounter: Payer: Self-pay | Admitting: Gynecologic Oncology

## 2021-11-14 ENCOUNTER — Encounter (HOSPITAL_COMMUNITY)
Admission: RE | Admit: 2021-11-14 | Discharge: 2021-11-14 | Disposition: A | Discharge status: Home / Self Care | Payer: 59 | Source: Ambulatory Visit | Attending: Gynecologic Oncology | Admitting: Gynecologic Oncology

## 2021-11-14 ENCOUNTER — Other Ambulatory Visit: Payer: Self-pay

## 2021-11-14 VITALS — BP 124/72 | HR 98 | Temp 98.8°F | Resp 18 | Ht 64.0 in | Wt 135.6 lb

## 2021-11-14 DIAGNOSIS — Z01812 Encounter for preprocedural laboratory examination: Secondary | ICD-10-CM | POA: Diagnosis present

## 2021-11-14 DIAGNOSIS — C569 Malignant neoplasm of unspecified ovary: Secondary | ICD-10-CM

## 2021-11-14 DIAGNOSIS — N8003 Adenomyosis of the uterus: Secondary | ICD-10-CM | POA: Insufficient documentation

## 2021-11-14 LAB — CBC
HCT: 37 % (ref 36.0–46.0)
Hemoglobin: 12.4 g/dL (ref 12.0–15.0)
MCH: 31.4 pg (ref 26.0–34.0)
MCHC: 33.5 g/dL (ref 30.0–36.0)
MCV: 93.7 fL (ref 80.0–100.0)
Platelets: 337 10*3/uL (ref 150–400)
RBC: 3.95 MIL/uL (ref 3.87–5.11)
RDW: 12.3 % (ref 11.5–15.5)
WBC: 7.2 10*3/uL (ref 4.0–10.5)
nRBC: 0 % (ref 0.0–0.2)

## 2021-11-14 LAB — COMPREHENSIVE METABOLIC PANEL
ALT: 13 U/L (ref 0–44)
AST: 17 U/L (ref 15–41)
Albumin: 3.9 g/dL (ref 3.5–5.0)
Alkaline Phosphatase: 55 U/L (ref 38–126)
Anion gap: 7 (ref 5–15)
BUN: 8 mg/dL (ref 6–20)
CO2: 24 mmol/L (ref 22–32)
Calcium: 9.6 mg/dL (ref 8.9–10.3)
Chloride: 105 mmol/L (ref 98–111)
Creatinine, Ser: 0.72 mg/dL (ref 0.44–1.00)
GFR, Estimated: >60 mL/min (ref >60)
Glucose, Bld: 97 mg/dL (ref 70–99)
Potassium: 4.2 mmol/L (ref 3.5–5.1)
Sodium: 136 mmol/L (ref 135–145)
Total Bilirubin: 0.3 mg/dL (ref 0.3–1.2)
Total Protein: 8.2 g/dL — ABNORMAL HIGH (ref 6.5–8.1)

## 2021-11-14 MED ORDER — IBUPROFEN 800 MG PO TABS: 800.0000 mg | ORAL_TABLET | Freq: Three times a day (TID) | ORAL | 0 refills | Status: DC | PRN

## 2021-11-14 MED ORDER — OXYCODONE HCL 5 MG PO TABS: 5.0000 mg | ORAL_TABLET | ORAL | 0 refills | Status: DC | PRN

## 2021-11-14 MED ORDER — SENNOSIDES-DOCUSATE SODIUM 8.6-50 MG PO TABS: 2.0000 | ORAL_TABLET | Freq: Every day | ORAL | 0 refills | Status: DC

## 2021-11-14 NOTE — Progress Notes (Signed)
Patient here for a pre-operative appointment prior to her scheduled surgery on November 21, 2021. She is scheduled for robotic assisted total laparoscopic hysterectomy, bilateral salpingectomy, possible laparotomy, and any other indicated procedures. She has her pre-admission testing appointment today at [facility].  The surgery was discussed in detail.  See after visit summary for additional details. Visual aids used to discuss items related to surgery including sequential compression stockings, foley catheter, IV pump, multi-modal pain regimen including tylenol, photo of the surgical robot, female reproductive system to discuss surgery in detail.      Discussed post-op pain management in detail including the aspects of the enhanced recovery pathway.  Advised her that a new prescription would be sent in for oxycodone and it is only to be used for after her upcoming surgery.  We discussed the use of tylenol post-op and to monitor for a maximum of 4,000 mg in a 24 hour period.  Also prescribed sennakot to be used after surgery and to hold if having loose stools.  Discussed bowel regimen in detail.     Discussed the use of heparin pre-op, SCDs, and measures to take at home to prevent DVT including frequent mobility.  Reportable signs and symptoms of DVT discussed. Post-operative instructions discussed and expectations for after surgery. Incisional care discussed as well including reportable signs and symptoms including erythema, drainage, wound separation.     10 minutes spent with the patient.  Verbalizing understanding of material discussed. No needs or concerns voiced at the end of the visit.   Advised patient to call for any needs.  Advised that her post-operative medications had been prescribed and could be picked up at any time. After this visit, she was escorted to [place] to check in for her pre-op labs.  This appointment is included in the global surgical bundle as pre-operative teaching and  has no charge.

## 2021-11-14 NOTE — Patient Instructions (Signed)
Preparing for your Surgery  Plan for surgery on November 21, 2021 with Dr. Jeral Pinch at [place] will be scheduled for robotic assisted total laparoscopic hysterectomy (removal of the uterus and cervix), bilateral salpingectomy (removal of both fallopian tubes), possible laparotomy (larger incision on your abdomen if needed), and any other indicated procedures.   Pre-operative Testing -(DONE) You will receive a phone call from presurgical testing at [facility] to arrange for a pre-operative appointment and lab work.  -Bring your insurance card, copy of an advanced directive if applicable, medication list  -At that visit, you will be asked to sign a consent for a possible blood transfusion in case a transfusion becomes necessary during surgery.  The need for a blood transfusion is rare but having consent is a necessary part of your care.     -You should not be taking blood thinners or aspirin at least ten days prior to surgery unless instructed by your surgeon.  -Do not take supplements such as fish oil (omega 3), red yeast rice, turmeric before your surgery. You want to avoid medications with aspirin in them including headache powders such as BC or Goody's), Excedrin migraine.  Day Before Surgery at [place] will be asked to take in a light diet the day before surgery. You will be advised you can have clear liquids up until 3 hours before your surgery.    Eat a light diet the day before surgery.  Examples including soups, broths, toast, yogurt, mashed potatoes.  AVOID GAS PRODUCING FOODS. Things to avoid include carbonated beverages (fizzy beverages, sodas), raw fruits and raw vegetables (uncooked), or beans.   If your bowels are filled with gas, your surgeon will have difficulty visualizing your pelvic organs which increases your surgical risks.  Your role in recovery Your role is to become active as soon as directed by your doctor, while still giving  yourself time to heal.  Rest when you feel tired. You will be asked to do the following in order to speed your recovery:  - Cough and breathe deeply. This helps to clear and expand your lungs and can prevent pneumonia after surgery.  - [place]. Do mild physical activity. Walking or moving your legs help your circulation and body functions return to normal. Do not try to get up or walk alone the first time after surgery.   -If you develop swelling on one leg or the other, pain in the back of your leg, redness/warmth in one of your legs, please call the office or go to the Emergency Room to have a doppler to rule out a blood clot. For shortness of breath, chest pain-seek care in the Emergency Room as soon as possible. - Actively manage your pain. Managing your pain lets you move in comfort. We will ask you to rate your pain on a scale of zero to 10. It is your responsibility to tell your doctor or nurse where and how much you hurt so your pain can be treated.  Special Considerations -If you are diabetic, you may be placed on insulin after surgery to have closer control over your blood sugars to promote healing and recovery.  This does not mean that you will be discharged on insulin.  If applicable, your oral antidiabetics will be resumed when you are tolerating a solid diet.  -Your final pathology results from surgery should be available around one week after surgery and the results will be relayed to you  when available.  -FMLA forms can be faxed to [phone] and please allow 5-7 business days for completion.  Pain Management After Surgery -You have been prescribed your pain medication (oxycodone) and bowel regimen medications before surgery so that you can have these available when you are discharged from the hospital. The pain medication is for use ONLY AFTER surgery and a new prescription will not be given.   -Make sure that you have Tylenol and Ibuprofen (prescription  strength prescribed) at home to use on a regular basis after surgery for pain control. We recommend alternating the medications every hour to six hours since they work differently and are processed in the body differently for pain relief.  -Review the attached handout on narcotic use and their risks and side effects.   Bowel Regimen -You have been prescribed Sennakot-S to take nightly to prevent constipation especially if you are taking the narcotic pain medication intermittently.  It is important to prevent constipation and drink adequate amounts of liquids. You can stop taking this medication when you are not taking pain medication and you are back on your normal bowel routine.  Risks of Surgery Risks of surgery are low but include bleeding, infection (you will receive an IV dose of antibiotics in surgery), damage to surrounding structures, re-operation, blood clots (you will receive a low dose injection of a blood thinner before surgery to help prevent this), and very rarely death.   Blood Transfusion Information (For the consent to be signed before surgery)  We will be checking your blood type before surgery so in case of emergencies, we will know what type of blood you would need.                                            WHAT IS A BLOOD TRANSFUSION?  A transfusion is the replacement of blood or some of its parts. Blood is made up of multiple cells which provide different functions. Red blood cells carry oxygen and are used for blood loss replacement. White blood cells fight against infection. Platelets control bleeding. Plasma helps clot blood. Other blood products are available for specialized needs, such as hemophilia or other clotting disorders. BEFORE THE TRANSFUSION  Who gives blood for transfusions?  You may be able to donate blood to be used at a later date on yourself (autologous donation). Relatives can be asked to donate blood. This is generally not any safer than if you  have received blood from a stranger. The same precautions are taken to ensure safety when a relative's blood is donated. Healthy volunteers who are fully evaluated to make sure their blood is safe. This is blood bank blood. Transfusion therapy is the safest it has ever been in the practice of medicine. Before blood is taken from a donor, a complete history is taken to make sure that person has no history of diseases nor engages in risky social behavior (examples are intravenous drug use or sexual activity with multiple partners). The donor's travel history is screened to minimize risk of transmitting infections, such as malaria. The donated blood is tested for signs of infectious diseases, such as HIV and hepatitis. The blood is then tested to be sure it is compatible with you in order to minimize the chance of a transfusion reaction. If you or a relative donates blood, this is often done in anticipation of surgery and is  not appropriate for emergency situations. It takes many days to process the donated blood. RISKS AND COMPLICATIONS Although transfusion therapy is very safe and saves many lives, the main dangers of transfusion include:  Getting an infectious disease. Developing a transfusion reaction. This is an allergic reaction to something in the blood you were given. Every precaution is taken to prevent this. The decision to have a blood transfusion has been considered carefully by your caregiver before blood is given. Blood is not given unless the benefits outweigh the risks.  AFTER SURGERY INSTRUCTIONS  Return to work: 4-6 weeks if applicable  Activity: 1. Be up and out of the bed during the day.  Take a nap if needed.  You may walk up steps but be careful and use the hand rail.  Stair climbing will tire you more than you think, you may need to stop part way and rest.   2. No lifting or straining for 6 weeks over 10 pounds. No pushing, pulling, straining for 6 weeks.  3. No driving for  around 1 week(s).  Do not drive if you are taking narcotic pain medicine and make sure that your reaction time has returned.   4. You can shower as soon as the next day after surgery. Shower daily.  Use your regular soap and water (not directly on the incision) and pat your incision(s) dry afterwards; don't rub.  No tub baths or submerging your body in water until cleared by your surgeon. If you have the soap that was given to you by pre-surgical testing that was used before surgery, you do not need to use it afterwards because this can irritate your incisions.   5. No sexual activity and nothing in the vagina for 8 weeks.  6. You may experience a small amount of clear drainage from your incisions, which is normal.  If the drainage persists, increases, or changes color please call the office.  7. Do not use creams, lotions, or ointments such as neosporin on your incisions after surgery until advised by your surgeon because they can cause removal of the dermabond glue on your incisions.    8. You may experience vaginal spotting after surgery or around the 6-8 week mark from surgery when the stitches at the top of the vagina begin to dissolve.  The spotting is normal but if you experience heavy bleeding, call our office.  9. Take Tylenol or ibuprofen first for pain and only use narcotic pain medication for severe pain not relieved by the Tylenol or Ibuprofen.  Monitor your Tylenol intake to a max of 4,000 mg in a 24 hour period. You can alternate these medications after surgery.  Diet: 1. Low sodium Heart Healthy Diet is recommended but you are cleared to resume your normal (before surgery) diet after your procedure.  2. It is safe to use a laxative, such as Miralax or Colace, if you have difficulty moving your bowels. You have been prescribed Sennakot-S to take at bedtime every evening after surgery to keep bowel movements regular and to prevent constipation.    Wound Care: 1. Keep clean and dry.   Shower daily.  Reasons to call the Doctor: Fever - Oral temperature greater than 100.4 degrees Fahrenheit Foul-smelling vaginal discharge Difficulty urinating Nausea and vomiting Increased pain at the site of the incision that is unrelieved with pain medicine. Difficulty breathing with or without chest pain New calf pain especially if only on one side Sudden, continuing increased vaginal bleeding with or without clots.  Contacts: For questions or concerns you should contact:  Dr. Jeral Pinch at [phone]  Joylene John, NP at [phone]  After Hours: call [phone] and have the GYN Oncologist paged/contacted (after 5 pm or on the weekends).  Messages sent via mychart are for non-urgent matters and are not responded to after hours so for urgent needs, please call the after hours number.

## 2021-11-15 ENCOUNTER — Other Ambulatory Visit (HOSPITAL_COMMUNITY): Payer: 59

## 2021-11-15 ENCOUNTER — Encounter: Payer: 59 | Admitting: Gynecologic Oncology

## 2021-11-20 ENCOUNTER — Telehealth: Payer: Self-pay

## 2021-11-20 NOTE — Telephone Encounter (Signed)
Telephone call to check on pre-operative status.  Patient compliant with pre-operative instructions.  Reinforced nothing to eat after midnight. Clear liquids until 0800. Patient to arrive at 0845.  No questions or concerns voiced.  Instructed to call for any needs.  

## 2021-11-21 ENCOUNTER — Ambulatory Visit (HOSPITAL_COMMUNITY)
Admission: RE | Admit: 2021-11-21 | Discharge: 2021-11-21 | Disposition: A | Discharge status: Home / Self Care | Payer: 59 | Attending: Gynecologic Oncology | Admitting: Gynecologic Oncology

## 2021-11-21 ENCOUNTER — Other Ambulatory Visit: Payer: Self-pay

## 2021-11-21 ENCOUNTER — Encounter (HOSPITAL_COMMUNITY)
Admission: RE | Disposition: A | Discharge status: Home / Self Care | Payer: Self-pay | Source: Home / Self Care | Attending: Gynecologic Oncology

## 2021-11-21 ENCOUNTER — Encounter (HOSPITAL_COMMUNITY): Payer: Self-pay | Admitting: Gynecologic Oncology

## 2021-11-21 ENCOUNTER — Ambulatory Visit (HOSPITAL_COMMUNITY): Payer: 59 | Admitting: Registered Nurse

## 2021-11-21 DIAGNOSIS — N8003 Adenomyosis of the uterus: Secondary | ICD-10-CM | POA: Diagnosis not present

## 2021-11-21 DIAGNOSIS — F32A Depression, unspecified: Secondary | ICD-10-CM | POA: Insufficient documentation

## 2021-11-21 DIAGNOSIS — R102 Pelvic and perineal pain: Secondary | ICD-10-CM | POA: Insufficient documentation

## 2021-11-21 DIAGNOSIS — Z20822 Contact with and (suspected) exposure to covid-19: Secondary | ICD-10-CM | POA: Diagnosis not present

## 2021-11-21 DIAGNOSIS — C569 Malignant neoplasm of unspecified ovary: Secondary | ICD-10-CM

## 2021-11-21 DIAGNOSIS — R87612 Low grade squamous intraepithelial lesion on cytologic smear of cervix (LGSIL): Secondary | ICD-10-CM | POA: Diagnosis not present

## 2021-11-21 HISTORY — PX: ROBOTIC ASSISTED LAPAROSCOPIC HYSTERECTOMY AND SALPINGECTOMY: SHX6379

## 2021-11-21 LAB — RESP PANEL BY RT-PCR (RSV, FLU A&B, COVID)  RVPGX2
Influenza A by PCR: NEGATIVE
Influenza B by PCR: NEGATIVE
Resp Syncytial Virus by PCR: NEGATIVE
SARS Coronavirus 2 by RT PCR: NEGATIVE

## 2021-11-21 LAB — ABO/RH: ABO/RH(D): A POS

## 2021-11-21 LAB — TYPE AND SCREEN
ABO/RH(D): A POS
Antibody Screen: NEGATIVE

## 2021-11-21 LAB — PREGNANCY, URINE: Preg Test, Ur: NEGATIVE

## 2021-11-21 SURGERY — XI ROBOTIC ASSISTED LAPAROSCOPIC HYSTERECTOMY AND SALPINGECTOMY
Anesthesia: General | Site: Abdomen

## 2021-11-21 MED ORDER — STERILE WATER FOR IRRIGATION IR SOLN
Status: DC | PRN
  Administered 2021-11-21: 1000 mL

## 2021-11-21 MED ORDER — FENTANYL CITRATE (PF) 100 MCG/2ML IJ SOLN
INTRAMUSCULAR | Status: DC | PRN
  Administered 2021-11-21: 50 ug via INTRAVENOUS
  Administered 2021-11-21: 100 ug via INTRAVENOUS
  Administered 2021-11-21: 50 ug via INTRAVENOUS

## 2021-11-21 MED ORDER — PROPOFOL 10 MG/ML IV BOLUS
INTRAVENOUS | Status: AC
  Filled 2021-11-21: qty 20

## 2021-11-21 MED ORDER — SCOPOLAMINE 1 MG/3DAYS TD PT72
1.0000 | MEDICATED_PATCH | TRANSDERMAL | Status: DC
  Administered 2021-11-21: 1.5 mg via TRANSDERMAL
  Filled 2021-11-21: qty 1

## 2021-11-21 MED ORDER — DEXAMETHASONE SODIUM PHOSPHATE 10 MG/ML IJ SOLN
INTRAMUSCULAR | Status: DC | PRN
  Administered 2021-11-21: 8 mg via INTRAVENOUS

## 2021-11-21 MED ORDER — ONDANSETRON HCL 4 MG/2ML IJ SOLN
INTRAMUSCULAR | Status: AC
  Filled 2021-11-21: qty 2

## 2021-11-21 MED ORDER — MIDAZOLAM HCL 2 MG/2ML IJ SOLN
INTRAMUSCULAR | Status: AC
  Filled 2021-11-21: qty 2

## 2021-11-21 MED ORDER — OXYCODONE HCL 5 MG PO TABS
ORAL_TABLET | ORAL | Status: AC
  Filled 2021-11-21: qty 1

## 2021-11-21 MED ORDER — CEFAZOLIN SODIUM-DEXTROSE 2-4 GM/100ML-% IV SOLN
2.0000 g | INTRAVENOUS | Status: AC
  Administered 2021-11-21: 2 g via INTRAVENOUS
  Filled 2021-11-21: qty 100

## 2021-11-21 MED ORDER — LIDOCAINE 2% (20 MG/ML) 5 ML SYRINGE
INTRAMUSCULAR | Status: DC | PRN
Start: 2021-11-21 — End: 2021-11-21
  Administered 2021-11-21: 60 mg via INTRAVENOUS

## 2021-11-21 MED ORDER — ENSURE PRE-SURGERY PO LIQD
296.0000 mL | Freq: Once | ORAL | Status: DC
  Filled 2021-11-21: qty 296

## 2021-11-21 MED ORDER — ORAL CARE MOUTH RINSE: 15.0000 mL | Freq: Once | OROMUCOSAL | Status: AC

## 2021-11-21 MED ORDER — GABAPENTIN 300 MG PO CAPS
300.0000 mg | ORAL_CAPSULE | ORAL | Status: AC
  Administered 2021-11-21: 300 mg via ORAL
  Filled 2021-11-21: qty 1

## 2021-11-21 MED ORDER — CHLORHEXIDINE GLUCONATE 0.12 % MT SOLN
15.0000 mL | Freq: Once | OROMUCOSAL | Status: AC
  Administered 2021-11-21: 15 mL via OROMUCOSAL

## 2021-11-21 MED ORDER — ACETAMINOPHEN 500 MG PO TABS: 1000.0000 mg | ORAL_TABLET | Freq: Once | ORAL | Status: DC

## 2021-11-21 MED ORDER — BUPIVACAINE HCL 0.25 % IJ SOLN
INTRAMUSCULAR | Status: DC | PRN
  Administered 2021-11-21: 34 mL

## 2021-11-21 MED ORDER — LACTATED RINGERS IV SOLN: INTRAVENOUS | Status: DC | PRN

## 2021-11-21 MED ORDER — MIDAZOLAM HCL 5 MG/5ML IJ SOLN
INTRAMUSCULAR | Status: DC | PRN
  Administered 2021-11-21: 2 mg via INTRAVENOUS

## 2021-11-21 MED ORDER — ROCURONIUM BROMIDE 10 MG/ML (PF) SYRINGE
PREFILLED_SYRINGE | INTRAVENOUS | Status: AC
  Filled 2021-11-21: qty 10

## 2021-11-21 MED ORDER — CELECOXIB 200 MG PO CAPS: 200.0000 mg | ORAL_CAPSULE | Freq: Once | ORAL | Status: DC

## 2021-11-21 MED ORDER — BUPIVACAINE HCL 0.25 % IJ SOLN
INTRAMUSCULAR | Status: AC
  Filled 2021-11-21: qty 1

## 2021-11-21 MED ORDER — FENTANYL CITRATE PF 50 MCG/ML IJ SOSY
25.0000 ug | PREFILLED_SYRINGE | INTRAMUSCULAR | Status: DC | PRN
  Administered 2021-11-21: 50 ug via INTRAVENOUS

## 2021-11-21 MED ORDER — LACTATED RINGERS IV SOLN: INTRAVENOUS | Status: DC

## 2021-11-21 MED ORDER — HEPARIN SODIUM (PORCINE) 5000 UNIT/ML IJ SOLN
5000.0000 [IU] | INTRAMUSCULAR | Status: AC
  Administered 2021-11-21: 5000 [IU] via SUBCUTANEOUS
  Filled 2021-11-21: qty 1

## 2021-11-21 MED ORDER — SUGAMMADEX SODIUM 200 MG/2ML IV SOLN
INTRAVENOUS | Status: DC | PRN
  Administered 2021-11-21: 200 mg via INTRAVENOUS

## 2021-11-21 MED ORDER — LIDOCAINE HCL (PF) 2 % IJ SOLN
INTRAMUSCULAR | Status: AC
  Filled 2021-11-21: qty 5

## 2021-11-21 MED ORDER — OXYCODONE HCL 5 MG/5ML PO SOLN: 5.0000 mg | Freq: Once | ORAL | Status: AC | PRN

## 2021-11-21 MED ORDER — FENTANYL CITRATE (PF) 100 MCG/2ML IJ SOLN
INTRAMUSCULAR | Status: AC
  Filled 2021-11-21: qty 2

## 2021-11-21 MED ORDER — PROPOFOL 10 MG/ML IV BOLUS
INTRAVENOUS | Status: DC | PRN
  Administered 2021-11-21: 200 mg via INTRAVENOUS

## 2021-11-21 MED ORDER — ROCURONIUM BROMIDE 10 MG/ML (PF) SYRINGE
PREFILLED_SYRINGE | INTRAVENOUS | Status: DC | PRN
  Administered 2021-11-21: 20 mg via INTRAVENOUS
  Administered 2021-11-21: 50 mg via INTRAVENOUS

## 2021-11-21 MED ORDER — LACTATED RINGERS IR SOLN
Status: DC | PRN
  Administered 2021-11-21: 1000 mL

## 2021-11-21 MED ORDER — ONDANSETRON HCL 4 MG/2ML IJ SOLN
INTRAMUSCULAR | Status: DC | PRN
  Administered 2021-11-21: 4 mg via INTRAVENOUS

## 2021-11-21 MED ORDER — CELECOXIB 200 MG PO CAPS
200.0000 mg | ORAL_CAPSULE | ORAL | Status: AC
  Administered 2021-11-21: 200 mg via ORAL
  Filled 2021-11-21: qty 1

## 2021-11-21 MED ORDER — DEXAMETHASONE SODIUM PHOSPHATE 4 MG/ML IJ SOLN: 4.0000 mg | INTRAMUSCULAR | Status: DC

## 2021-11-21 MED ORDER — OXYCODONE HCL 5 MG PO TABS
5.0000 mg | ORAL_TABLET | Freq: Once | ORAL | Status: AC | PRN
  Administered 2021-11-21: 5 mg via ORAL

## 2021-11-21 MED ORDER — PROMETHAZINE HCL 25 MG/ML IJ SOLN: 6.2500 mg | INTRAMUSCULAR | Status: DC | PRN

## 2021-11-21 MED ORDER — DEXAMETHASONE SODIUM PHOSPHATE 10 MG/ML IJ SOLN
INTRAMUSCULAR | Status: AC
  Filled 2021-11-21: qty 1

## 2021-11-21 MED ORDER — ACETAMINOPHEN 500 MG PO TABS
1000.0000 mg | ORAL_TABLET | ORAL | Status: AC
  Administered 2021-11-21: 1000 mg via ORAL
  Filled 2021-11-21: qty 2

## 2021-11-21 MED ORDER — FENTANYL CITRATE PF 50 MCG/ML IJ SOSY
PREFILLED_SYRINGE | INTRAMUSCULAR | Status: AC
  Administered 2021-11-21: 50 ug via INTRAVENOUS
  Filled 2021-11-21: qty 3

## 2021-11-21 SURGICAL SUPPLY — 72 items
APPLICATOR SURGIFLO ENDO (HEMOSTASIS) IMPLANT
BACTOSHIELD CHG 4% 4OZ (MISCELLANEOUS) ×1
BAG COUNTER SPONGE SURGICOUNT (BAG) IMPLANT
BAG LAPAROSCOPIC 12 15 PORT 16 (BASKET) IMPLANT
BAG RETRIEVAL 12/15 (BASKET)
BLADE SURG SZ10 CARB STEEL (BLADE) IMPLANT
COVER BACK TABLE 60X90IN (DRAPES) ×3 IMPLANT
COVER TIP SHEARS 8 DVNC (MISCELLANEOUS) ×2 IMPLANT
COVER TIP SHEARS 8MM DA VINCI (MISCELLANEOUS) ×1
DERMABOND ADVANCED (GAUZE/BANDAGES/DRESSINGS) ×1
DERMABOND ADVANCED .7 DNX12 (GAUZE/BANDAGES/DRESSINGS) ×2 IMPLANT
DRAPE ARM DVNC X/XI (DISPOSABLE) ×8 IMPLANT
DRAPE COLUMN DVNC XI (DISPOSABLE) ×2 IMPLANT
DRAPE DA VINCI XI ARM (DISPOSABLE) ×4
DRAPE DA VINCI XI COLUMN (DISPOSABLE) ×1
DRAPE SHEET LG 3/4 BI-LAMINATE (DRAPES) ×3 IMPLANT
DRAPE SURG IRRIG POUCH 19X23 (DRAPES) ×3 IMPLANT
DRSG OPSITE POSTOP 4X6 (GAUZE/BANDAGES/DRESSINGS) IMPLANT
DRSG OPSITE POSTOP 4X8 (GAUZE/BANDAGES/DRESSINGS) IMPLANT
ELECT PENCIL ROCKER SW 15FT (MISCELLANEOUS) IMPLANT
ELECT REM PT RETURN 15FT ADLT (MISCELLANEOUS) ×3 IMPLANT
GAUZE 4X4 16PLY ~~LOC~~+RFID DBL (SPONGE) ×3 IMPLANT
GLOVE SURG ENC MOIS LTX SZ6 (GLOVE) ×12 IMPLANT
GLOVE SURG ENC MOIS LTX SZ6.5 (GLOVE) ×6 IMPLANT
GOWN STRL REUS W/ TWL LRG LVL3 (GOWN DISPOSABLE) ×8 IMPLANT
GOWN STRL REUS W/TWL LRG LVL3 (GOWN DISPOSABLE) ×4
HOLDER FOLEY CATH W/STRAP (MISCELLANEOUS) IMPLANT
IRRIG SUCT STRYKERFLOW 2 WTIP (MISCELLANEOUS) ×3
IRRIGATION SUCT STRKRFLW 2 WTP (MISCELLANEOUS) ×2 IMPLANT
KIT PROCEDURE DA VINCI SI (MISCELLANEOUS)
KIT PROCEDURE DVNC SI (MISCELLANEOUS) IMPLANT
KIT TURNOVER KIT A (KITS) IMPLANT
MANIPULATOR ADVINCU DEL 3.0 PL (MISCELLANEOUS) IMPLANT
MANIPULATOR ADVINCU DEL 3.5 PL (MISCELLANEOUS) IMPLANT
MANIPULATOR UTERINE 4.5 ZUMI (MISCELLANEOUS) ×2 IMPLANT
NDL HYPO 21X1.5 SAFETY (NEEDLE) ×1 IMPLANT
NDL SPNL 18GX3.5 QUINCKE PK (NEEDLE) IMPLANT
NEEDLE HYPO 21X1.5 SAFETY (NEEDLE) ×3 IMPLANT
NEEDLE SPNL 18GX3.5 QUINCKE PK (NEEDLE) IMPLANT
OBTURATOR OPTICAL STANDARD 8MM (TROCAR) ×1
OBTURATOR OPTICAL STND 8 DVNC (TROCAR) ×2
OBTURATOR OPTICALSTD 8 DVNC (TROCAR) ×2 IMPLANT
PACK ROBOT GYN CUSTOM WL (TRAY / TRAY PROCEDURE) ×3 IMPLANT
PAD POSITIONING PINK XL (MISCELLANEOUS) ×3 IMPLANT
PORT ACCESS TROCAR AIRSEAL 12 (TROCAR) ×2 IMPLANT
PORT ACCESS TROCAR AIRSEAL 5M (TROCAR) ×1
POUCH SPECIMEN RETRIEVAL 10MM (ENDOMECHANICALS) IMPLANT
SCRUB CHG 4% DYNA-HEX 4OZ (MISCELLANEOUS) ×2 IMPLANT
SEAL CANN UNIV 5-8 DVNC XI (MISCELLANEOUS) ×8 IMPLANT
SEAL XI 5MM-8MM UNIVERSAL (MISCELLANEOUS) ×4
SET TRI-LUMEN FLTR TB AIRSEAL (TUBING) ×3 IMPLANT
SPIKE FLUID TRANSFER (MISCELLANEOUS) ×3 IMPLANT
SPONGE T-LAP 18X18 ~~LOC~~+RFID (SPONGE) IMPLANT
SURGIFLO W/THROMBIN 8M KIT (HEMOSTASIS) IMPLANT
SUT MNCRL AB 4-0 PS2 18 (SUTURE) IMPLANT
SUT PDS AB 1 TP1 96 (SUTURE) IMPLANT
SUT VIC AB 0 CT1 27 (SUTURE)
SUT VIC AB 0 CT1 27XBRD ANTBC (SUTURE) IMPLANT
SUT VIC AB 2-0 CT1 27 (SUTURE)
SUT VIC AB 2-0 CT1 TAPERPNT 27 (SUTURE) IMPLANT
SUT VIC AB 2-0 SH 27 (SUTURE) ×1
SUT VIC AB 2-0 SH 27X BRD (SUTURE) ×1 IMPLANT
SUT VIC AB 4-0 PS2 18 (SUTURE) ×6 IMPLANT
SUT VICRYL 0 UR6 27IN ABS (SUTURE) ×2 IMPLANT
SYR 10ML LL (SYRINGE) IMPLANT
TOWEL OR NON WOVEN STRL DISP B (DISPOSABLE) IMPLANT
TRAP SPECIMEN MUCUS 40CC (MISCELLANEOUS) IMPLANT
TRAY FOLEY MTR SLVR 16FR STAT (SET/KITS/TRAYS/PACK) ×3 IMPLANT
TROCAR XCEL NON-BLD 5MMX100MML (ENDOMECHANICALS) IMPLANT
UNDERPAD 30X36 HEAVY ABSORB (UNDERPADS AND DIAPERS) ×6 IMPLANT
WATER STERILE IRR 1000ML POUR (IV SOLUTION) ×3 IMPLANT
YANKAUER SUCT BULB TIP 10FT TU (MISCELLANEOUS) IMPLANT

## 2021-11-21 NOTE — Discharge Instructions (Addendum)
AFTER SURGERY INSTRUCTIONS   Return to work: 4-6 weeks if applicable  Today, Dr. Berline Lopes removed your uterus, cervix, and both fallopian tubes. Your ovaries looked normal.   Dr. Berline Lopes said it is fine to stop taking your birth control pills.   Activity: 1. Be up and out of the bed during the day.  Take a nap if needed.  You may walk up steps but be careful and use the hand rail.  Stair climbing will tire you more than you think, you may need to stop part way and rest.    2. No lifting or straining for 6 weeks over 10 pounds. No pushing, pulling, straining for 6 weeks.   3. No driving for around 1 week(s).  Do not drive if you are taking narcotic pain medicine and make sure that your reaction time has returned.    4. You can shower as soon as the next day after surgery. Shower daily.  Use your regular soap and water (not directly on the incision) and pat your incision(s) dry afterwards; don't rub.  No tub baths or submerging your body in water until cleared by your surgeon. If you have the soap that was given to you by pre-surgical testing that was used before surgery, you do not need to use it afterwards because this can irritate your incisions.    5. No sexual activity and nothing in the vagina for 8 weeks.   6. You may experience a small amount of clear drainage from your incisions, which is normal.  If the drainage persists, increases, or changes color please call the office.   7. Do not use creams, lotions, or ointments such as neosporin on your incisions after surgery until advised by your surgeon because they can cause removal of the dermabond glue on your incisions.     8. You may experience vaginal spotting after surgery or around the 6-8 week mark from surgery when the stitches at the top of the vagina begin to dissolve.  The spotting is normal but if you experience heavy bleeding, call our office.   9. Take Tylenol or ibuprofen first for pain and only use narcotic pain medication  for severe pain not relieved by the Tylenol or Ibuprofen.  Monitor your Tylenol intake to a max of 4,000 mg in a 24 hour period. You can alternate these medications after surgery.   Diet: 1. Low sodium Heart Healthy Diet is recommended but you are cleared to resume your normal (before surgery) diet after your procedure.   2. It is safe to use a laxative, such as Miralax or Colace, if you have difficulty moving your bowels. You have been prescribed Sennakot-S to take at bedtime every evening after surgery to keep bowel movements regular and to prevent constipation.     Wound Care: 1. Keep clean and dry.  Shower daily.   Reasons to call the Doctor: Fever - Oral temperature greater than 100.4 degrees Fahrenheit Foul-smelling vaginal discharge Difficulty urinating Nausea and vomiting Increased pain at the site of the incision that is unrelieved with pain medicine. Difficulty breathing with or without chest pain New calf pain especially if only on one side Sudden, continuing increased vaginal bleeding with or without clots.   Contacts: For questions or concerns you should contact:   Dr. Jeral Pinch at [phone]   Joylene John, NP at [phone]   After Hours: call [phone] and have the GYN Oncologist paged/contacted (after 5 pm or on the weekends).   Messages sent  via mychart are for non-urgent matters and are not responded to after hours so for urgent needs, please call the after hours number.

## 2021-11-21 NOTE — Anesthesia Procedure Notes (Signed)

## 2021-11-21 NOTE — Anesthesia Preprocedure Evaluation (Addendum)
Anesthesia Evaluation  Patient identified by MRN, date of birth, ID band Patient awake    Reviewed: Allergy & Precautions, NPO status , Patient's Chart, lab work & pertinent test results  History of Anesthesia Complications Negative for: history of anesthetic complications  Airway Mallampati: II  TM Distance: >3 FB Neck ROM: Full    Dental  (+) Dental Advisory Given, Teeth Intact   Pulmonary neg pulmonary ROS,    Pulmonary exam normal        Cardiovascular negative cardio ROS Normal cardiovascular exam     Neuro/Psych  Headaches, PSYCHIATRIC DISORDERS Depression    GI/Hepatic Neg liver ROS,  IBS    Endo/Other  negative endocrine ROS  Renal/GU negative Renal ROS     Musculoskeletal negative musculoskeletal ROS (+)   Abdominal   Peds  Hematology negative hematology ROS (+)   Anesthesia Other Findings HSV  Reproductive/Obstetrics                           Anesthesia Physical Anesthesia Plan  ASA: 2  Anesthesia Plan: General   Post-op Pain Management: Tylenol PO (pre-op) and Celebrex PO (pre-op)   Induction: Intravenous  PONV Risk Score and Plan: 4 or greater and Treatment may vary due to age or medical condition, Ondansetron, Dexamethasone, Midazolam and Scopolamine patch - Pre-op  Airway Management Planned: Oral ETT  Additional Equipment: None  Intra-op Plan:   Post-operative Plan: Extubation in OR  Informed Consent: I have reviewed the patients History and Physical, chart, labs and discussed the procedure including the risks, benefits and alternatives for the proposed anesthesia with the patient or authorized representative who has indicated his/her understanding and acceptance.     Dental advisory given  Plan Discussed with: CRNA and Anesthesiologist  Anesthesia Plan Comments:        Anesthesia Quick Evaluation

## 2021-11-21 NOTE — Anesthesia Postprocedure Evaluation (Signed)
Anesthesia Post Note  Patient: Krista Mcintyre  Procedure(s) Performed: XI ROBOTIC ASSISTED LAPAROSCOPIC HYSTERECTOMY AND Bilateral SALPINGECTOMY (Abdomen)     Patient location during evaluation: PACU Anesthesia Type: General Level of consciousness: awake and alert Pain management: pain level controlled Vital Signs Assessment: post-procedure vital signs reviewed and stable Respiratory status: spontaneous breathing, nonlabored ventilation, respiratory function stable and patient connected to nasal cannula oxygen Cardiovascular status: blood pressure returned to baseline and stable Postop Assessment: no apparent nausea or vomiting Anesthetic complications: no   No notable events documented.  Last Vitals:  Vitals:   11/21/21 1510 11/21/21 1530  BP: 133/88 129/86  Pulse: 84 70  Resp: 13   Temp: 37.1 C 36.7 C  SpO2: 99% 100%    Last Pain:  Vitals:   11/21/21 1530  TempSrc: Oral  PainSc: [name]

## 2021-11-21 NOTE — Op Note (Signed)
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 IUD strings protruding form the cervix. On intra-abdominal entry, normal upper abdominal survey. Normal small and large bowel. Uterus 8-10 cm and normal in appearance. Normal bilateral adnexa. 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 No ascites. No intra-abdominal or pelvic evidence of disease.   Estimated Blood Loss:  50cc      Total IV Fluids: see I&O flowsheet         Specimens: uterus, cervix, bilateral tubes, IUD         Complications:  None apparent; patient tolerated the procedure well.         Disposition: PACU - hemodynamically stable.  Procedure Details  The patient was seen in the Holding Room. The risks, benefits, complications, treatment options, and expected outcomes were discussed with the patient.  The patient concurred with the proposed plan, giving informed consent.  The site of surgery properly noted/marked. The patient was identified as Krista Mcintyre and the procedure verified as a Robotic-assisted hysterectomy with bilateral salpingectomy.   After induction of anesthesia, the patient was draped and prepped in the usual sterile manner. Patient was placed in supine position after anesthesia and draped and prepped in the usual sterile manner as follows: Her arms were tucked to her side with all appropriate precautions.  The shoulders were stabilized with padded shoulder blocks applied to the acromium processes.  The patient was placed in the semi-lithotomy position in [employer].   The perineum and vagina were prepped with CholoraPrep. The patient was draped after the CholoraPrep had been allowed to dry for 3 minutes.  A Time Out was held and the above information confirmed.  The urethra was prepped with Betadine. Foley catheter was placed.  A sterile speculum was placed in the vagina.  The cervix was grasped with a single-tooth tenaculum. The cervix was dilated with Kennon Rounds dilators.  The ZUMI uterine manipulator with a medium colpotomizer ring was placed without difficulty.  A pneum occluder balloon was placed over the manipulator.  OG tube placement was confirmed and to suction.   Next, a 10 mm skin incision was made 1 cm below the subcostal margin in the midclavicular line.  The 5 mm Optiview port and scope was used for direct entry.  Opening pressure was under 10 mm CO2.  The abdomen was insufflated and the findings were noted as above.   At this point and all points during the procedure, the patient's intra-abdominal pressure did not exceed 15 mmHg. Next, an 8 mm skin incision was made at the umbilicus and a right and left port were placed about 8 cm lateral to the robot port on the right and left side. The 5 mm assist trocar was exchanged for a 10-12 mm port. All ports were placed under direct visualization.  The patient was placed in steep Trendelenburg.  Bowel was folded away into the upper abdomen.  The robot was docked in the normal manner.  The right and left peritoneum were opened parallel to the IP ligament to open the retroperitoneal spaces bilaterally. The round ligaments were transected. The ureter was again noted to be on the medial leaf of  the broad ligament.  The fallopian tube was elevated and electrocautery was used to cauterize and then transect just inferior to the fallopian tube, free it from the ovary. The utero-ovarian ligament was then skeletonized, cauterized and cut.    The posterior peritoneum was taken down to the level of the KOH ring.  The anterior  peritoneum was also taken down.  The bladder flap was created to the level of the KOH ring.  The uterine artery on the right side was skeletonized, cauterized and cut in the normal manner.  A similar procedure was performed on the left.  The colpotomy was made and the uterus, cervix, bilateral tubes were amputated and delivered through the vagina.  Pedicles were inspected and excellent hemostasis was achieved.    The colpotomy at the vaginal cuff was closed with Vicryl on a CT1 needle in running manner.  Some bleeding was noted along the bladder peritoneum just superior to the cuff closure. Running 2-0 Vicryl was used to oversew this area and achieve hemostasis.  Irrigation was used and excellent hemostasis was achieved.    At this point in the procedure was completed.  Robotic instruments were removed under direct visulaization.  The robot was undocked. The fascia at the 10-12 mm port was closed with 0 Vicryl.  The subcuticular tissue was closed with 4-0 Vicryl and the skin was closed with 4-0 Monocryl in a subcuticular manner.  Dermabond was applied.    The vagina was swabbed with  minimal bleeding noted. Foley catheter was removed. All sponge, lap and needle counts were correct x  3.   The patient was transferred to the recovery room in stable condition.  Jeral Pinch, MD

## 2021-11-21 NOTE — Transfer of Care (Signed)
Immediate Anesthesia Transfer of Care Note  Patient: Krista Mcintyre  Procedure(s) Performed: XI ROBOTIC ASSISTED LAPAROSCOPIC HYSTERECTOMY AND Bilateral SALPINGECTOMY (Abdomen)  Patient Location: PACU  Anesthesia Type:General  Level of Consciousness: awake, alert , oriented and patient cooperative  Airway & Oxygen Therapy: Patient Spontanous Breathing and Patient connected to face mask oxygen  Post-op Assessment: Report given to RN, Post -op Vital signs reviewed and stable and Patient moving all extremities  Post vital signs: Reviewed and stable  Last Vitals:  Vitals Value Taken Time  BP 130/83 11/21/21 1433  Temp    Pulse 97 11/21/21 1435  Resp 10 11/21/21 1435  SpO2 100 % 11/21/21 1435  Vitals shown include unvalidated device data.  Last Pain:  Vitals:   11/21/21 1221  TempSrc: Oral  PainSc:       Patients Stated Pain Goal: 5 (83/38/25 0539)  Complications: No notable events documented.

## 2021-11-21 NOTE — Interval H&P Note (Signed)
History and Physical Interval Note:  11/21/2021 12:19 PM  Krista Mcintyre  has presented today for surgery, with the diagnosis of Serous borderline tumor of unknown primary.  The various methods of treatment have been discussed with the patient and family. After consideration of risks, benefits and other options for treatment, the patient has consented to  Procedure(s): XI ROBOTIC ASSISTED LAPAROSCOPIC HYSTERECTOMY AND Bilateral SALPINGECTOMY (N/A) possible, LAPAROTOMY (N/A) as a surgical intervention.  The patient's history has been reviewed, patient examined, no change in status, stable for surgery.  I have reviewed the patient's chart and labs.  Questions were answered to the patient's satisfaction.     Lafonda Mosses

## 2021-11-22 ENCOUNTER — Telehealth: Payer: Self-pay

## 2021-11-22 ENCOUNTER — Encounter (HOSPITAL_COMMUNITY): Payer: Self-pay | Admitting: Gynecologic Oncology

## 2021-11-22 LAB — URINE CULTURE: Culture: NO GROWTH

## 2021-11-22 NOTE — Telephone Encounter (Signed)
Spoke with Ms. Krista Mcintyre this morning. She states she is eating, drinking and urinating well. She has not had a BM yet and is not passing gas. She is taking senokot as prescribed and encouraged her to drink plenty of water. She denies fever or chills. Incisions are dry and intact. She rates her pain 6-7/10. She has been alternating tylenol and ibuprofen and has been taking oxycodone every 4 hours. The pain is in her lower abdomen/pelvis. Patient states" The pain is similar to a cramp in your side after running." Pain is constant and started after her surgery yesterday. She denies vaginal bleeding or spotting. Joylene John, NP notified. Advised patient to alternate applying heat and ice to lower abdomen and to notify our office by the end of the day today if pain has not improved.    Instructed to call office with any fever, chills, purulent drainage, uncontrolled pain or any other questions or concerns. Patient verbalizes understanding.   Pt aware of post op appointments as well as the office number [phone] and after hours number [phone] to call if she has any questions or concerns

## 2021-11-23 ENCOUNTER — Telehealth: Payer: Self-pay

## 2021-11-23 ENCOUNTER — Other Ambulatory Visit: Payer: Self-pay | Admitting: Gynecologic Oncology

## 2021-11-23 DIAGNOSIS — C569 Malignant neoplasm of unspecified ovary: Secondary | ICD-10-CM

## 2021-11-23 MED ORDER — OXYCODONE HCL 5 MG PO TABS: 5.0000 mg | ORAL_TABLET | Freq: Four times a day (QID) | ORAL | 0 refills | Status: DC | PRN

## 2021-11-23 NOTE — Progress Notes (Signed)
See RN note.

## 2021-11-23 NOTE — Telephone Encounter (Signed)
Received call from Ms. McGehee this afternoon. She reports the applying ice to the abdomen did help some. She rates her pain as 5.5/10 now. She states "I was counting my pills and it looks like I'm going to run out of oxycodone over the weekend. Could a refill be called in?" Patient reports having 2 oxycodone left. She has been alternating tylenol and ibuprofen and taking oxycodone every 4 hours. Joylene John, NP notified. Advised patient that a refill has been sent in for oxycodone. The refill is for 10 tablets. Encouraged the patient to space out taking the oxycodone (not every 4 hours). Patient verbalized understanding. Instructed to call with any needs.

## 2021-11-27 LAB — CYTOLOGY - NON PAP

## 2021-11-30 ENCOUNTER — Telehealth: Payer: Self-pay

## 2021-11-30 ENCOUNTER — Encounter: Payer: Self-pay | Admitting: Gynecologic Oncology

## 2021-11-30 NOTE — Telephone Encounter (Signed)
Received call from Ms. Krista Mcintyre this morning. Patient states " I've been having itching around all my incisions. The incision near my belly button is the worst. It is incredibly itchy and is constant. It hurts when I rub it. I've tried applying benadryl cream which seems to help some but I'm not sure if its a placebo affect. The problem is not going away though."  Patient denies, redness, swelling , or drainage to incision sites. She denies fever or chills. She has not used any new lotions, soaps or detergents.  Patient is going to send picture via mychart of incisions. Advised that we will contact her once the images are reviewed. Patient verbalized understanding.

## 2021-11-30 NOTE — Telephone Encounter (Signed)
Following up with Krista Mcintyre this afternoon. The images of her incision have been reviewed by Joylene John, NP. Per NP advised to use Vaseline or neosporin to loosen and remove the glue. If itching persists after the glue is removed patient can try applying hydrocortisone cream. If still having itching she can try taking oral benadryl. Patient verbalized understanding and did not have any questions. Instructed to call with any concerns and if itching persists.

## 2021-12-05 ENCOUNTER — Telehealth: Payer: Self-pay | Admitting: *Deleted

## 2021-12-05 NOTE — Telephone Encounter (Signed)
Patient called and stated "I had surgery 2/8 with Dr Berline Lopes. I started having a new pain this past weekend. It is deep in my central pelvic area and it is different than the incision pain I had after surgery. The patient is getting worse. The patient is mostly dull and is about at a 2-3, but than the sharp pain will get to a 6-7. The pain gets sharp when I go from sitting to standing, laugh or urinate. I have no increase in urination, no bleeding, discharge, or burning. I have not done any heavy lifting and nothing in the vagina. I live 2 1/2 hours away and instead of coming in there and to know if Dr Berline Lopes would referral me to a doctor closer to home." Explained that Dr Berline Lopes is in the OR today and the message would be given to Dr Berline Lopes and Lenna Sciara APP. Explained that the office would call her back later today

## 2021-12-05 NOTE — Telephone Encounter (Signed)
Patient called back and said that she talked with her parents and PCP.  PCP is not available to see her today.  Patient requested an appointment with Dr. Berline Lopes tomorrow.  Informed her that Dr. Berline Lopes will not be here but Joylene John, NP can see her.  Appointment scheduled for Thursday, February 23rd @ 0930.   Patient requested a note that stated she had a procedure done and is being seen for symptoms.  Patient stated that the flight is for a campus visit for her PHD.

## 2021-12-05 NOTE — Telephone Encounter (Signed)
Called patient back after speaking with Lenna Sciara, NP and Dr. Berline Lopes. Patient stated "this pain is a different pain than I have ever had before. My body felt normal 1 week after surgery and had pain all of a sudden this past weekend. It feels like a deep, internal, vaginal/pelvic pain that radiates out.  The pain is dull when not moving, but the pain is sharp when moving.  When sitting, standing up, or laughing is when it hurts the worst.  When urinating I feel pressure." Patient is urinating and having BM. Patient denies any urinary frequency, blood, or discharge.   Dr.Tucker is recommending lab work, urine sample, pelvic exam and possibly a CT scan. Patient lives further away and wanted to be seen in her town [place], [place]. Patient does have a PCP that labs and urine can be evaluated. Dr. Berline Lopes does not recommend having a pelvic exam with just anyone since she is so close from her surgery date.  Patient then proceeds to tell me that she has a flight to [place], [place] tomorrow and asked if she should get these tests done before she leaves. Spoke with Lenna Sciara, NP and informed her that it is her decision if she wants to fly to [place], [place] tomorrow.  Recommended that the patient should be evaluated with her PCP or urgent care before she decided to fly. Patient stated "I am going to call my PCP to see if I can be squeezed in and if not then I will go to an urgent care.  I am still thinking about flying, but not 100 %."

## 2021-12-06 ENCOUNTER — Inpatient Hospital Stay: Payer: 59

## 2021-12-06 ENCOUNTER — Telehealth: Payer: Self-pay | Admitting: Gynecologic Oncology

## 2021-12-06 ENCOUNTER — Encounter: Payer: Self-pay | Admitting: Gynecologic Oncology

## 2021-12-06 ENCOUNTER — Other Ambulatory Visit: Payer: Self-pay | Admitting: Gynecologic Oncology

## 2021-12-06 ENCOUNTER — Telehealth: Payer: Self-pay

## 2021-12-06 ENCOUNTER — Inpatient Hospital Stay (HOSPITAL_BASED_OUTPATIENT_CLINIC_OR_DEPARTMENT_OTHER): Payer: 59 | Admitting: Gynecologic Oncology

## 2021-12-06 ENCOUNTER — Ambulatory Visit (HOSPITAL_COMMUNITY)
Admission: RE | Admit: 2021-12-06 | Discharge: 2021-12-06 | Disposition: A | Discharge status: Home / Self Care | Payer: 59 | Source: Ambulatory Visit | Attending: Gynecologic Oncology | Admitting: Gynecologic Oncology

## 2021-12-06 ENCOUNTER — Other Ambulatory Visit: Payer: Self-pay

## 2021-12-06 VITALS — BP 121/81 | HR 109 | Temp 98.9°F | Resp 16 | Ht 62.0 in | Wt 131.0 lb

## 2021-12-06 DIAGNOSIS — R102 Pelvic and perineal pain: Secondary | ICD-10-CM

## 2021-12-06 DIAGNOSIS — R109 Unspecified abdominal pain: Secondary | ICD-10-CM | POA: Insufficient documentation

## 2021-12-06 DIAGNOSIS — G8918 Other acute postprocedural pain: Secondary | ICD-10-CM

## 2021-12-06 DIAGNOSIS — R1084 Generalized abdominal pain: Secondary | ICD-10-CM

## 2021-12-06 DIAGNOSIS — R3989 Other symptoms and signs involving the genitourinary system: Secondary | ICD-10-CM

## 2021-12-06 DIAGNOSIS — C569 Malignant neoplasm of unspecified ovary: Secondary | ICD-10-CM

## 2021-12-06 LAB — CBC WITH DIFFERENTIAL (CANCER CENTER ONLY)
Abs Immature Granulocytes: 0.02 10*3/uL (ref 0.00–0.07)
Basophils Absolute: 0.1 10*3/uL (ref 0.0–0.1)
Basophils Relative: 1 %
Eosinophils Absolute: 0.3 10*3/uL (ref 0.0–0.5)
Eosinophils Relative: 4 %
HCT: 33.8 % — ABNORMAL LOW (ref 36.0–46.0)
Hemoglobin: 11.5 g/dL — ABNORMAL LOW (ref 12.0–15.0)
Immature Granulocytes: 0 %
Lymphocytes Relative: 30 %
Lymphs Abs: 2.2 10*3/uL (ref 0.7–4.0)
MCH: 30.5 pg (ref 26.0–34.0)
MCHC: 34 g/dL (ref 30.0–36.0)
MCV: 89.7 fL (ref 80.0–100.0)
Monocytes Absolute: 0.3 10*3/uL (ref 0.1–1.0)
Monocytes Relative: 4 %
Neutro Abs: 4.4 10*3/uL (ref 1.7–7.7)
Neutrophils Relative %: 61 %
Platelet Count: 288 10*3/uL (ref 150–400)
RBC: 3.77 MIL/uL — ABNORMAL LOW (ref 3.87–5.11)
RDW: 11.8 % (ref 11.5–15.5)
WBC Count: 7.2 10*3/uL (ref 4.0–10.5)
nRBC: 0 % (ref 0.0–0.2)

## 2021-12-06 LAB — URINALYSIS, COMPLETE (UACMP) WITH MICROSCOPIC
Bilirubin Urine: NEGATIVE
Glucose, UA: NEGATIVE mg/dL
Ketones, ur: NEGATIVE mg/dL
Nitrite: NEGATIVE
Protein, ur: NEGATIVE mg/dL
Specific Gravity, Urine: 1.008 (ref 1.005–1.030)
pH: 7 (ref 5.0–8.0)

## 2021-12-06 LAB — BASIC METABOLIC PANEL
Anion gap: 6 (ref 5–15)
BUN: 13 mg/dL (ref 6–20)
CO2: 26 mmol/L (ref 22–32)
Calcium: 9.4 mg/dL (ref 8.9–10.3)
Chloride: 106 mmol/L (ref 98–111)
Creatinine, Ser: 0.62 mg/dL (ref 0.44–1.00)
GFR, Estimated: >60 mL/min (ref >60)
Glucose, Bld: 92 mg/dL (ref 70–99)
Potassium: 3.9 mmol/L (ref 3.5–5.1)
Sodium: 138 mmol/L (ref 135–145)

## 2021-12-06 MED ORDER — IOHEXOL 300 MG/ML  SOLN
100.0000 mL | Freq: Once | INTRAMUSCULAR | Status: AC | PRN
  Administered 2021-12-06: 100 mL via INTRAVENOUS

## 2021-12-06 NOTE — Progress Notes (Signed)
See RN note. Patient has called the office reporting increasing abdominal pain post-op.    Per pt "It feels like a deep, internal, vaginal/pelvic pain that radiates out.  The pain is dull when not moving, but the pain is sharp when moving.  When sitting, standing up, or laughing is when it hurts the worst.  When urinating I feel pressure."  CT and labs ordered to further evaluate.

## 2021-12-06 NOTE — Progress Notes (Signed)
GYN Oncology Post-op Symptom Management Visit                                                                        Krista Mcintyre 26 y.o. 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 On 11/21/2021, she underwent robotic-assisted laparoscopic total hysterectomy with bilateral salpingectomy, oversew of bladder peritoneum with Dr. Jeral Pinch. 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  Final pathology resulted: FINAL MICROSCOPIC DIAGNOSIS:  A. UTERUS, CERVIX, BILATERAL TUBES, IUD, HYSTERECTOMY AND SALPINGECTOMY:  Uterus:  -  Inactive endometrium with progestin effect  -  No hyperplasia or malignancy identified  Cervix:   -  Low grade squamous intraepithelial lesion (CIN1, mild dysplasia)  -  No malignancy identified  Fallopian tubes, bilateral:  -  Benign fallopian tube  -  No malignancy identified  B. PERITONEAL NODULE, BIOPSY:  -  Stromal fibrosis  -  No malignancy identified   Interval History: The patient presents to the office for evaluation of increasing lower abdominal/pelvic pain postoperatively.  The patient states she was doing well after surgery initially. She did some light stretching on Friday, November 30, 2021 and then on Saturday noticed a change in abdominal discomfort.  She states the pain that she is used to usually starts in the mid pelvis and radiates outward related to her preoperative adenomyosis.  This new pain feels like pressure going into the mid pelvis and feels deep.  It worsens with sitting and standing and at the end of urination.  She has had a change in her vaginal discharge to a pink color with no foul odor  appreciated.  No fever or chills at home.  Tolerating diet with no nausea or emesis.  States her bowels have been moving regularly.  No hematuria reported along with no frequency or urgency.  The only bladder symptoms she is experiencing is pressure and increase in the pain at the end of urination.  No concerns voiced with her abdominal incisions.  Review of Systems: See interval HPI. Additional review of systems negative.  Current Meds:  Outpatient Encounter Medications as of 12/06/2021  Medication Sig   buPROPion (WELLBUTRIN XL) 150 MG 24 hr tablet Take 150 mg by mouth every morning.   Cholecalciferol 125 MCG (5000 UT) TABS Take 5,000 Units by mouth daily.   ferrous sulfate 325 (65 FE) MG tablet Take 325 mg by mouth daily with breakfast.   ibuprofen (ADVIL) 800 MG tablet Take 1 tablet (800 mg total) by mouth every 8 (eight) hours as needed for moderate pain. For AFTER surgery only   levonorgestrel (MIRENA) 20 MCG/DAY IUD 1 each by Intrauterine route once.   nortriptyline (PAMELOR) 10 MG capsule Take 10 mg by mouth at bedtime.   ondansetron (ZOFRAN-ODT) 4 MG disintegrating tablet Take 1 tablet by mouth every 8 (eight) hours as needed.   oxyCODONE (OXY IR/ROXICODONE) 5 MG immediate release tablet Take 1 tablet (5  mg total) by mouth every 6 (six) hours as needed for severe pain. For AFTER surgery only, do not take and drive   senna-docusate (SENOKOT-S) 8.6-50 MG tablet Take 2 tablets by mouth at bedtime. For AFTER surgery, do not take if having diarrhea   No facility-administered encounter medications on file as of 12/06/2021.    Allergy: No Known Allergies  Social Hx:   Social History   Socioeconomic History   Marital status: Single    Spouse name: Not on file   Number of children: Not on file   Years of education: Not on file   Highest education level: Not on file  Occupational History   Not on file  Tobacco Use   Smoking status: Never   Smokeless tobacco: Never  Vaping Use    Vaping Use: Never used  Substance and Sexual Activity   Alcohol use: Yes   Drug use: Never   Sexual activity: Not on file  Other Topics Concern   Not on file  Social History Narrative   Not on file   Social Determinants of Health   Financial Resource Strain: Not on file  Food Insecurity: Not on file  Transportation Needs: Not on file  Physical Activity: Not on file  Stress: Not on file  Social Connections: Not on file  Intimate Partner Violence: Not on file    Past Surgical Hx:  Past Surgical History:  Procedure Laterality Date   GUM SURGERY     Gum grafting   LAPAROSCOPIC ENDOMETRIOSIS FULGURATION  11/2019   LAPAROSCOPY  02/2020   omentectomy, peritoneal biopsies   ROBOTIC ASSISTED LAPAROSCOPIC HYSTERECTOMY AND SALPINGECTOMY N/A 11/21/2021   Procedure: XI ROBOTIC ASSISTED LAPAROSCOPIC HYSTERECTOMY AND Bilateral SALPINGECTOMY;  Surgeon: Lafonda Mosses, MD;  Location: [facility];  Service: Gynecology;  Laterality: N/A;    Past Medical Hx:  Past Medical History:  Diagnosis Date   Adenomyosis    Adenomyosis    Depression    Dysmenorrhea    Headache    Herpes genitalia    IBS (irritable bowel syndrome)    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 rate 16, height 5\' 2"  (1.575 m), weight 131 lb (59.4 kg), SpO2 100 %.  CBC    Component Value Date/Time   WBC 7.2 12/06/2021 0954   WBC 7.2 11/14/2021 1320   RBC 3.77 (L) 12/06/2021 0954   HGB 11.5 (L) 12/06/2021 0954   HCT 33.8 (L) 12/06/2021 0954   PLT 288 12/06/2021 0954   MCV 89.7 12/06/2021 0954   MCH 30.5 12/06/2021 0954   MCHC 34.0 12/06/2021 0954   RDW 11.8 12/06/2021 0954   LYMPHSABS 2.2  12/06/2021 0954   MONOABS 0.3 12/06/2021 0954   EOSABS 0.3 12/06/2021 0954   BASOSABS 0.1 12/06/2021 0954   BMET    Component Value Date/Time   NA 138 12/06/2021 0954   K 3.9 12/06/2021 0954   CL 106 12/06/2021 0954   CO2 26 12/06/2021 0954   GLUCOSE 92 12/06/2021 0954   BUN 13 12/06/2021 0954   CREATININE 0.62 12/06/2021 0954   CALCIUM 9.4 12/06/2021 0954   GFRNONAA >60 12/06/2021 0954  Urinalysis  Component Value Date/Time   COLORURINE STRAW (A) 12/06/2021 0951   APPEARANCEUR CLEAR 12/06/2021 0951   LABSPEC 1.008 12/06/2021 0951   PHURINE 7.0 12/06/2021 0951   GLUCOSEU NEGATIVE 12/06/2021 0951   HGBUR SMALL (A) 12/06/2021 0951   BILIRUBINUR NEGATIVE 12/06/2021 0951   KETONESUR NEGATIVE 12/06/2021 0951   PROTEINUR NEGATIVE 12/06/2021 0951   NITRITE NEGATIVE 12/06/2021 0951   LEUKOCYTESUR TRACE (A) 12/06/2021 0951   Physical Exam:  General: Well developed, well nourished female in no acute distress. Alert and oriented x 3.   Cardiovascular: Regular rate and rhythm. S1 and S2 normal.  Lungs: Clear to auscultation bilaterally. No wheezes/crackles/rhonchi noted.  Skin: No rashes or lesions present. Back: No CVA tenderness.  Abdomen: Abdomen soft, non-tender and non-obese. Active bowel sounds in all quadrants. No evidence of a fluid wave or abdominal masses. Mild tenderness reported with palpation of the lower abdomen. Genitourinary:    Vulva/vagina: Normal external female genitalia. No lesions.    Urethra: No lesions or masses    Vagina: Vaginal cuff intact with suture present. No lesions. No palpable masses or increased tenderness with palpation. No vaginal bleeding or drainage noted.  Extremities: No bilateral cyanosis, edema, or clubbing.    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 Jeral Pinch on 11/21/2021. Presenting to the office today for evaluation of worsening post-op abdominal pain. Lab results discussed. Dr. Berline Lopes updated on current situation. Given worsening abdominal pain and no significant exam findings on today's exam with normal labs, Dr. Berline Lopes would like to proceed with a CT scan to further evaluate the abdomen and pelvis for any potential post-operative complications causing pain. Patient will plan to have CT scan today and will be contacted with the results. She has a scheduled flight later today to [place] and is undecided about going. Advised of Dr. Charisse March recommendation for no travel for at least 2 weeks post-op. All questions answered. Further recommendations to come if any after CT scan results are available.     Dorothyann Gibbs, NP 12/06/2021, 12:07 PM

## 2021-12-06 NOTE — Patient Instructions (Addendum)
Your lab work from today looks normal. No concerning findings on today's examination. We can plan to proceed with the CT scan to evaluate the abdomen/pelvis given your increasing pain symptoms. We will contact you with the results.

## 2021-12-06 NOTE — Telephone Encounter (Signed)
Spoke with Ms. Krista Mcintyre this morning. Advised her not to eat or drink anything prior to her appointment this morning in case a CT scan needs to be ordered. Patient verbalized understanding, she reports eating a hash brown at 8 am this morning but she states she we will not eat anything else.

## 2021-12-06 NOTE — Telephone Encounter (Signed)
Called patient and discussed CT scan results.  Advised her that Dr. Berline Lopes would be reviewing the images as well.  We discussed an aggressive bowel regimen especially based on the CT results showing a moderate to large amount of stool in the colon and rectum.  I will send her a MyChart with recommendations for different bowel regimens.  In regards to her trip to [place] later today, she is undecided at this time. Advised her to call for any questions or concerns.

## 2021-12-11 ENCOUNTER — Telehealth: Payer: Self-pay

## 2021-12-11 NOTE — Telephone Encounter (Signed)
Spoke with Krista Mcintyre this afternoon. Patient reports she is doing a lot better.  " I don't really have pain anymore, just some pressure and it's manageable." Patient has implemented a bowel routine which is working for her. Advised to call with any questions or concerns.

## 2021-12-12 ENCOUNTER — Other Ambulatory Visit (HOSPITAL_COMMUNITY): Payer: 59

## 2021-12-13 ENCOUNTER — Encounter: Payer: 59 | Admitting: Gynecologic Oncology

## 2021-12-18 ENCOUNTER — Encounter: Payer: 59 | Admitting: Gynecologic Oncology

## 2021-12-24 ENCOUNTER — Other Ambulatory Visit: Payer: 59

## 2021-12-24 ENCOUNTER — Other Ambulatory Visit: Payer: Self-pay

## 2021-12-24 ENCOUNTER — Encounter: Payer: Self-pay | Admitting: Gynecologic Oncology

## 2021-12-24 ENCOUNTER — Ambulatory Visit (HOSPITAL_COMMUNITY): Payer: 59

## 2021-12-24 ENCOUNTER — Inpatient Hospital Stay: Payer: 59 | Attending: Gynecologic Oncology | Admitting: Gynecologic Oncology

## 2021-12-24 VITALS — BP 125/65 | HR 94 | Temp 98.9°F | Resp 16 | Ht 61.81 in | Wt 132.2 lb

## 2021-12-24 DIAGNOSIS — G8929 Other chronic pain: Secondary | ICD-10-CM

## 2021-12-24 DIAGNOSIS — C569 Malignant neoplasm of unspecified ovary: Secondary | ICD-10-CM

## 2021-12-24 DIAGNOSIS — Z7189 Other specified counseling: Secondary | ICD-10-CM

## 2021-12-24 DIAGNOSIS — N8003 Adenomyosis of the uterus: Secondary | ICD-10-CM | POA: Insufficient documentation

## 2021-12-24 DIAGNOSIS — D3910 Neoplasm of uncertain behavior of unspecified ovary: Secondary | ICD-10-CM | POA: Insufficient documentation

## 2021-12-24 DIAGNOSIS — R102 Pelvic and perineal pain: Secondary | ICD-10-CM

## 2021-12-24 DIAGNOSIS — Z90722 Acquired absence of ovaries, bilateral: Secondary | ICD-10-CM

## 2021-12-24 DIAGNOSIS — Z90710 Acquired absence of both cervix and uterus: Secondary | ICD-10-CM

## 2021-12-24 NOTE — Patient Instructions (Signed)
It was good to see you today.  I am glad you are feeling better after surgery!  Remember, lifting restrictions are in place for 6 weeks and nothing in the vagina for at least 8 weeks. ? ?We will plan to have a follow-up visit in 6 months if you are still in the area where we will do an exam, ultrasound, and blood work.  If and when you know that you are leaving the area, please let me know and I can help send a referral for you to establish care where you will be moving. ?

## 2021-12-24 NOTE — Progress Notes (Signed)
Gynecologic Oncology Return Clinic Visit  12/24/2021  Reason for Visit: Follow-up after surgery, treatment discussion  Treatment History: Early 2021:  initially seen by the [facility] service at [facility] [place] for chronic pelvic pain. Management with continuous OCP was unhelpful as was duloxetine. Endometriosis was suspected and she was consented for laparoscopic fulguration and IUD placement.  12/03/2019 underwent operative laparoscopy for excision of endometriosis and placement of mIUD with MIS, biopsy of adhesion and pelvic side wall implant. Findings from surgery - "3cm area over the right pelvic sidewall with a vesicular plaque, visibly suspicious for endometriosis, just medial to the right ureter. 1cm area of similar appearing plaque over the left ureter. Bilateral ureters visualized retroperitoneally and lateralized from the operative sites. Otherwise normal posterior cul de sac. All visible disease excised."  Final path returned serous borderline tumor.  Referred to gyn oncology. 01/11/2020 case discussed in tumor board recommendation with CT abdomen pelvis and diagnostic laparoscopy with washing and peritoneal biopsy with omentectomy was recommended. 01/19/2020 CT scan revealed no visible disease or enlarged lymph nodes. 02/2020: Reoperation with Dr. Cecil Cranker ([place]) for pelvic washings, omentectomy, and peritoneal stripping with biopsies. Cytology was positive for neoplastic cells but all other biopsies were negative.  04/2020: seen at [facility] for follow-up. Moved to [place], [place] 07/2020: Established care at [facility]. CA-125 was 8. 06/19/2021: CA- 125 was 15.6. 06/19/2021: Pelvic ultrasound exam showed normal sonographic appearance of the ovaries, no masses or free fluid.  Normal uterus and endometrium, IUD in correct position. 11/21/2021: Total robotic hysterectomy with bilateral salpingectomy, oversew of bladder peritoneum.  Final pathology revealed inactive endometrium with progesterone effect.  Cervix with  low-grade squamous intraepithelial lesion, no malignancy.  Bilateral fallopian tubes normal in appearance.  Peritoneal nodule biopsy with stromal fibrosis, no malignancy.  Washings showed atypical mesothelial cells.  Interval History: Patient reports doing well.  She was seen about 2 weeks after surgery with postoperative pelvic pain.  She underwent work-up for this including labs and imaging, none of which revealed obvious source for her pain other than postoperative healing.  Today, she reports doing much better.  She has no significant abdominal or pelvic pain.  She reports bowel function is improving, denies any urinary symptoms.  Had some irritation related to her right lower quadrant incision, otherwise incisions have been healing well.  Notes some ongoing fatigue and soreness, improving daily.  Denies any vaginal bleeding or discharge.  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     Past Surgical History:  Procedure Laterality Date   GUM SURGERY     Gum grafting   LAPAROSCOPIC ENDOMETRIOSIS FULGURATION  11/2019   LAPAROSCOPY  02/2020   omentectomy, peritoneal biopsies   ROBOTIC ASSISTED LAPAROSCOPIC HYSTERECTOMY AND SALPINGECTOMY N/A 11/21/2021   Procedure: XI ROBOTIC ASSISTED LAPAROSCOPIC HYSTERECTOMY AND Bilateral SALPINGECTOMY;  Surgeon: Lafonda Mosses, MD;  Location: [facility];  Service: Gynecology;  Laterality: N/A;    Family History  Problem Relation Age of Onset   Breast cancer Maternal Grandmother    Kidney cancer Maternal Grandfather    Cervical cancer Paternal Grandmother     Social History   Socioeconomic History   Marital status: Single    Spouse name: Not on file   Number of children:  Not on file   Years of education: Not on file    Highest education level: Not on file  Occupational History   Not on file  Tobacco Use   Smoking status: Never   Smokeless tobacco: Never  Vaping Use   Vaping Use: Never used  Substance and Sexual Activity   Alcohol use: Yes   Drug use: Never   Sexual activity: Not on file  Other Topics Concern   Not on file  Social History Narrative   Not on file   Social Determinants of Health   Financial Resource Strain: Not on file  Food Insecurity: Not on file  Transportation Needs: Not on file  Physical Activity: Not on file  Stress: Not on file  Social Connections: Not on file    Current Medications:  Current Outpatient Medications:    buPROPion (WELLBUTRIN XL) 150 MG 24 hr tablet, Take 150 mg by mouth every morning., Disp: , Rfl:    nortriptyline (PAMELOR) 10 MG capsule, Take 10 mg by mouth at bedtime., Disp: , Rfl:    ondansetron (ZOFRAN-ODT) 4 MG disintegrating tablet, Take 1 tablet by mouth every 8 (eight) hours as needed., Disp: , Rfl:    Cholecalciferol 125 MCG (5000 UT) TABS, Take 5,000 Units by mouth daily. (Patient not taking: Reported on 12/24/2021), Disp: , Rfl:    ferrous sulfate 325 (65 FE) MG tablet, Take 325 mg by mouth daily with breakfast. (Patient not taking: Reported on 12/24/2021), Disp: , Rfl:    ibuprofen (ADVIL) 800 MG tablet, Take 1 tablet (800 mg total) by mouth every 8 (eight) hours as needed for moderate pain. For AFTER surgery only, Disp: 30 tablet, Rfl: 0   levonorgestrel (MIRENA) 20 MCG/DAY IUD, 1 each by Intrauterine route once., Disp: , Rfl:    oxyCODONE (OXY IR/ROXICODONE) 5 MG immediate release tablet, Take 1 tablet (5 mg total) by mouth every 6 (six) hours as needed for severe pain. For AFTER surgery only, do not take and drive, Disp: 10 tablet, Rfl: 0   senna-docusate (SENOKOT-S) 8.6-50 MG tablet, Take 2 tablets by mouth at bedtime. For AFTER surgery, do not take if having diarrhea, Disp: 30 tablet, Rfl: 0  Review of Systems: Denies appetite changes,  fevers, chills, fatigue, unexplained weight changes. Denies hearing loss, neck lumps or masses, mouth sores, ringing in ears or voice changes. Denies cough or wheezing.  Denies shortness of breath. Denies chest pain or palpitations. Denies leg swelling. Denies abdominal distention, pain, blood in stools, diarrhea, nausea, vomiting, or early satiety. Denies pain with intercourse, dysuria, frequency, hematuria or incontinence. Denies hot flashes, pelvic pain, vaginal bleeding or vaginal discharge.   Denies joint pain, back pain or muscle pain/cramps. Denies itching, rash, or wounds. Denies dizziness, headaches, numbness or seizures. Denies swollen lymph nodes or glands, denies easy bruising or bleeding. Denies anxiety, depression, confusion, or decreased concentration.  Physical Exam: BP 125/65 (BP Location: Left Arm, Patient Position: Sitting)    Pulse 94    Temp 98.9 F (37.2 C) (Oral)    Resp 16    Ht 5' 1.81" (1.57 m)    Wt 132 lb 3.2 oz (60 kg)    SpO2 100%    BMI 24.33 kg/m  General: Alert, oriented, no acute distress. HEENT: Normocephalic, atraumatic, sclera anicteric. Chest: Unlabored breathing on room air. Abdomen: soft, nontender.  Normoactive bowel sounds.  No masses or hepatosplenomegaly appreciated.  Well-healed incisions. Extremities: Grossly normal range of motion.  Warm, well perfused.  No edema bilaterally.  Skin: No rashes or lesions noted. GU: Normal appearing external genitalia without erythema, excoriation, or lesions.  Speculum exam reveals vaginal cuff intact, suture still visible.  No bleeding or discharge.  Bimanual exam reveals cuff intact, no fluctuance or tenderness with palpation.    Laboratory & Radiologic Studies: A. UTERUS, CERVIX, BILATERAL TUBES, IUD, HYSTERECTOMY AND SALPINGECTOMY:  Uterus:  -  Inactive endometrium with progestin effect  -  No hyperplasia or malignancy identified   Cervix:  -  Low grade squamous intraepithelial lesion (CIN1, mild  dysplasia)  -  No malignancy identified    Fallopian tubes, bilateral:  -  Benign fallopian tube  -  No malignancy identified   B. PERITONEAL NODULE, BIOPSY:  -  Stromal fibrosis  -  No malignancy identified   FINAL MICROSCOPIC DIAGNOSIS:  - Atypical mesothelial cells present   Assessment & Plan: Krista Mcintyre is a 26 y.o. 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  Patient is overall healing very well from surgery and meeting milestones.  Discussed continued postoperative expectations as well as restrictions.  Reviewed pathology report with her and she was given a copy for her own records.  I am somewhat surprised that there was no findings of adenomyosis within the uterus.  I am going to reach out to pathology and have them reexamine the uterus.  Patient reports at this time complete resolution of the prior pelvic pain that she was experiencing.  We discussed findings of low-grade dysplasia on her cervix.  I will have pathology comment on whether the margins of her cervix were negative.  Discussed that washings again showed atypical mesothelial cells.  Ovaries were normal at the time of surgery.  No other remarkable findings on prior imaging including postoperative CT scan.  Discussed recommendation for continued surveillance in the setting of borderline tumor of unknown primary.  I recommend continued visits with an exam every 6 months accompanied by pelvic ultrasound and CA-125 while the patient still has her ovaries in situ.  Completion surgery with bilateral oophorectomy should be considered closer to the natural age of menopause.  The patient will likely be moving in the near future as she has applied to graduate school.  She still does not know where she will end up.  I have asked her to  keep me posted so that if she is leaving the area I can help her find someone with whom to follow up.  I think it would be very reasonable for her to see an OB/GYN for her follow-up or to alternate between an OB/GYN and a GYN oncologist every 6 months.  32 minutes of total time was spent for this patient encounter, including preparation, face-to-face counseling with the patient and coordination of care, and documentation of the encounter.  Jeral Pinch, MD  Division of Gynecologic Oncology  Department of Obstetrics and Gynecology  [facility]

## 2021-12-25 LAB — SURGICAL PATHOLOGY

## 2021-12-31 ENCOUNTER — Telehealth: Payer: Self-pay | Admitting: *Deleted

## 2021-12-31 NOTE — Telephone Encounter (Signed)
Spoke with pt this afternoon regarding her after hours triage call and ED visit. Pt stated that Saturday she experienced increased bleeding. It was red blood that covered half of a Always lightest size pad. She only had to use 1 pad for the bleeding then it stopped. Her pain was a 8/10 and it felt like heavy pressure, deep pain in the pelvic area. The pain woke her up twice from her sleep and she decided to go get checked out in the ED at [facility]. There they did "look in the vaginal area" and did a CT scan. The CT scan was negative and according to pt "they stated everything looked find in the vaginal area". They prescribed her some Vicoden 5/325 which she has been taking every 5-6 hours. It has been helping with her pain. Her pain right now is a 4/10. She stated that last week she has been pushing herself more at work. 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 She stated she feels much better compared to Saturday and has not had any more bleeding. ?Denies introducing anything into the vaginal area, fever, chills. Stated urine and BMs have been normal.  Educated on the use of using ice pack to help with pain and taking it easy. Informed her we would let the providers know. Pt verbalized understanding and did not voice any other concerns.  ?

## 2021-12-31 NOTE — Telephone Encounter (Signed)
Spoke with Krista Mcintyre this afternoon. Advised patient that Dr. Berline Lopes would like to examine her in the office this Tuesday or Wednesday. Patient verbalized understanding, she states " I am driving right now and will need to call the office back later this afternoon or tomorrow morning once I have looked at my schedule." Advised patient that on Tuesday Dr. Berline Lopes is available around 1:30 pm and on Wednesday she is available around 12:45 pm.She verbalized understanding.   ?

## 2021-12-31 NOTE — Telephone Encounter (Signed)
Could we bring her in tomorrow or Wednesday - ideally timing wise so that I can come over between cases to do exam with [facility]

## 2021-12-31 NOTE — Telephone Encounter (Signed)
Patient left message stating she can come to the clinic on Wednesday 01/02/22 at 12:45 pm to follow up with Dr. Berline Lopes. Appointment has been scheduled.  ?

## 2022-01-02 ENCOUNTER — Other Ambulatory Visit: Payer: Self-pay | Admitting: Gynecologic Oncology

## 2022-01-02 ENCOUNTER — Encounter (HOSPITAL_COMMUNITY): Payer: Self-pay | Admitting: Gynecologic Oncology

## 2022-01-02 ENCOUNTER — Inpatient Hospital Stay: Payer: 59

## 2022-01-02 ENCOUNTER — Inpatient Hospital Stay (HOSPITAL_BASED_OUTPATIENT_CLINIC_OR_DEPARTMENT_OTHER): Payer: 59 | Admitting: Gynecologic Oncology

## 2022-01-02 ENCOUNTER — Other Ambulatory Visit: Payer: Self-pay

## 2022-01-02 VITALS — BP 129/85 | HR 105 | Temp 98.9°F | Resp 16 | Ht 62.0 in | Wt 129.0 lb

## 2022-01-02 DIAGNOSIS — N8003 Adenomyosis of the uterus: Secondary | ICD-10-CM | POA: Diagnosis present

## 2022-01-02 DIAGNOSIS — T8131XA Disruption of external operation (surgical) wound, not elsewhere classified, initial encounter: Secondary | ICD-10-CM

## 2022-01-02 DIAGNOSIS — C569 Malignant neoplasm of unspecified ovary: Secondary | ICD-10-CM

## 2022-01-02 DIAGNOSIS — D3910 Neoplasm of uncertain behavior of unspecified ovary: Secondary | ICD-10-CM | POA: Diagnosis present

## 2022-01-02 DIAGNOSIS — R000 Tachycardia, unspecified: Secondary | ICD-10-CM

## 2022-01-02 LAB — CBC WITH DIFFERENTIAL (CANCER CENTER ONLY)
Abs Immature Granulocytes: 0.03 10*3/uL (ref 0.00–0.07)
Basophils Absolute: 0 10*3/uL (ref 0.0–0.1)
Basophils Relative: 1 %
Eosinophils Absolute: 0.2 10*3/uL (ref 0.0–0.5)
Eosinophils Relative: 2 %
HCT: 37.1 % (ref 36.0–46.0)
Hemoglobin: 12.3 g/dL (ref 12.0–15.0)
Immature Granulocytes: 0 %
Lymphocytes Relative: 24 %
Lymphs Abs: 1.9 10*3/uL (ref 0.7–4.0)
MCH: 29.9 pg (ref 26.0–34.0)
MCHC: 33.2 g/dL (ref 30.0–36.0)
MCV: 90 fL (ref 80.0–100.0)
Monocytes Absolute: 0.5 10*3/uL (ref 0.1–1.0)
Monocytes Relative: 6 %
Neutro Abs: 5.4 10*3/uL (ref 1.7–7.7)
Neutrophils Relative %: 67 %
Platelet Count: 287 10*3/uL (ref 150–400)
RBC: 4.12 MIL/uL (ref 3.87–5.11)
RDW: 12.3 % (ref 11.5–15.5)
WBC Count: 8 10*3/uL (ref 4.0–10.5)
nRBC: 0 % (ref 0.0–0.2)

## 2022-01-02 LAB — BASIC METABOLIC PANEL
Anion gap: 9 (ref 5–15)
BUN: 6 mg/dL (ref 6–20)
CO2: 24 mmol/L (ref 22–32)
Calcium: 9.4 mg/dL (ref 8.9–10.3)
Chloride: 104 mmol/L (ref 98–111)
Creatinine, Ser: 0.55 mg/dL (ref 0.44–1.00)
GFR, Estimated: >60 mL/min (ref >60)
Glucose, Bld: 93 mg/dL (ref 70–99)
Potassium: 4.2 mmol/L (ref 3.5–5.1)
Sodium: 137 mmol/L (ref 135–145)

## 2022-01-02 MED ORDER — METRONIDAZOLE 500 MG PO TABS: 500.0000 mg | ORAL_TABLET | Freq: Three times a day (TID) | ORAL | 0 refills | Status: DC

## 2022-01-02 NOTE — Anesthesia Preprocedure Evaluation (Addendum)
Anesthesia Evaluation  ?Patient identified by MRN, date of birth, ID band ?Patient awake ? ? ? ?Reviewed: ?Allergy & Precautions, NPO status , Patient's Chart, lab work & pertinent test results ? ?History of Anesthesia Complications ?Negative for: history of anesthetic complications ? ?Airway ?Mallampati: I ? ?TM Distance: >3 FB ?Neck ROM: Full ? ? ? Dental ? ?(+) Dental Advisory Given, Teeth Intact ?  ?Pulmonary ?neg pulmonary ROS,  ?  ?breath sounds clear to auscultation ? ? ? ? ? ? Cardiovascular ?negative cardio ROS ? ? ?Rhythm:Regular Rate:Normal ? ? ?  ?Neuro/Psych ? Headaches, Depression   ? GI/Hepatic ?negative GI ROS, Neg liver ROS,   ?Endo/Other  ? ? Renal/GU ?negative Renal ROS  ? ?  ?Musculoskeletal ? ? Abdominal ?  ?Peds ? Hematology ?negative hematology ROS ?(+)   ?Anesthesia Other Findings ? ? Reproductive/Obstetrics ? ?  ? ? ? ? ? ? ? ? ? ? ? ? ? ?  ?  ? ? ? ? ? ? ? ?Anesthesia Physical ?Anesthesia Plan ? ?ASA: 2 ? ?Anesthesia Plan: General  ? ?Post-op Pain Management: Tylenol PO (pre-op)*  ? ?Induction: Intravenous ? ?PONV Risk Score and Plan: 3 and Ondansetron, Dexamethasone and Scopolamine patch - Pre-op ? ?Airway Management Planned: Oral ETT ? ?Additional Equipment: None ? ?Intra-op Plan:  ? ?Post-operative Plan: Extubation in OR ? ?Informed Consent: I have reviewed the patients History and Physical, chart, labs and discussed the procedure including the risks, benefits and alternatives for the proposed anesthesia with the patient or authorized representative who has indicated his/her understanding and acceptance.  ? ? ? ?Dental advisory given ? ?Plan Discussed with: CRNA and Surgeon ? ?Anesthesia Plan Comments:   ? ? ? ? ? ?Anesthesia Quick Evaluation ? ?

## 2022-01-02 NOTE — Progress Notes (Signed)
For Short Stay: ?[facility] appointment date: N/A ?Date of COVID positive in last 90 days: N/A ? ?Bowel Prep reminder: N/A ? ? ?For Anesthesia: ?PCP -  [facility] ?Cardiologist - N/A ? ?Chest x-ray - N/A ?EKG - N/A ?Stress Test - N/A ?ECHO - N/A ?Cardiac Cath - N/A ?Pacemaker/ICD device last checked:N/A ?Pacemaker orders received:N/A ?Device Rep notified:N/A ? ?Spinal Cord Stimulator:N/A ? ?Sleep Study - N/A ?CPAP - N/A ? ?Fasting Blood Sugar - N/A ?Checks Blood Sugar ___N/A__ times a day ?Date and result of last Hgb A1c-N/A ? ?Blood Thinner Instructions:N/A ?Aspirin Instructions:N/A ?Last Dose:N/A ? ?Activity level: Can go up a flight of stairs and activities of daily living without stopping and without chest pain and/or shortness of breath   ? ?Anesthesia review: N/A ? ?Patient denies shortness of breath, fever, cough and chest pain at PAT appointment ? ? ?Patient verbalized understanding of instructions that were given to them at the PAT appointment. Patient was also instructed that they will need to review over the PAT instructions again at home before surgery.  ?

## 2022-01-02 NOTE — Progress Notes (Signed)
GYN Oncology Post-op Symptom Management Visit ?                                                                       ?Krista Mcintyre 26 y.o. female ? ?CC:  ?Chief Complaint  ?Patient presents with  ? Serous cystadenoma, borderline malignancy, unspecified late  ? 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 On 11/21/2021, she underwent robotic-assisted laparoscopic total hysterectomy with bilateral salpingectomy, oversew of bladder peritoneum with Dr. Jeral Pinch. 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 ? ?Final pathology resulted: ?FINAL MICROSCOPIC DIAGNOSIS:  ?A. UTERUS, CERVIX, BILATERAL TUBES, IUD, HYSTERECTOMY AND SALPINGECTOMY:  ?Uterus:  ?-  Inactive endometrium with progestin effect  ?-  No hyperplasia or malignancy identified  ?Cervix:   ?-  Low grade squamous intraepithelial lesion (CIN1, mild dysplasia)  ?-  No malignancy identified  ?Fallopian tubes, bilateral:  ?-  Benign fallopian tube  ?-  No malignancy identified  ?B. PERITONEAL NODULE, BIOPSY:  ?-  Stromal fibrosis  ?-  No malignancy identified  ? ?Post-operatively, she underwent CT AP imaging and lab work on 12/06/21 due to increasing abdominal pain. CT imaging was negative for acute abdominopelvic findings with moderate to large volume of stool in the colon. Labs were appropriate given recent surgery and pre-operative values. She was last seen in the office on December 24, 2021 for post-operative follow up with normal examination at that time.  ? ?Interval History:  ?She presents today to the office for follow-up on recent ER evaluation.  She states she had one of the busiest days she has ever had at work this past Friday.  During the night on Friday, she was woken up by deep  pelvic pain/pressure. Later in the night, she woke with vaginal bleeding.  This was not described as dark or bright and filled up half of a thin maxi pad.  On Saturday, she had minimal spotting throughout the day.  She presented to the emergency room in [place] on December 29, 2021 for further evaluation of vaginal bleeding and pelvic pain.  At this visit, she had repeat lab work with her white blood cell count at 9.8, hemoglobin 12.3, hematocrit 36.4, platelet count 246, bmet normal with creatinine at 0.80.  She also underwent CT imaging, which showed postoperative changes of a hysterectomy with small amount of fluid within the pelvis without peripherally enhancing collection to suggest abscess.  The bowel was without obstruction.  No pneumoperitoneum.  No pathologically enlarged lymph nodes.  She also underwent pelvic examination by the ER provider with findings noted as "moderate amount of blood in the vagina, cervical cuff appears to have a blood clot in the center of it, unable to tell if sutures still in place."  At discharge, she was recommended to push fluids, not place anything in the vagina, prescribed Zofran and Norco 5/325, and to follow-up with her GYN surgeon. ? ?Today she states she has had no pain or pelvic pressure.  She stopped taking her medications on Monday of  this week and states the pain has gotten better each day.  She has not had any more vaginal bleeding.  No fever or chills reported.  Bowels and bladder working without issues.  No abdominal pain or bloating. 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 ? ?Review of Systems: ?See interval HPI. Additional review of systems negative. ? ?Current Meds:  ?Outpatient Encounter Medications as of 01/02/2022  ?Medication Sig  ? buPROPion (WELLBUTRIN XL) 150 MG 24 hr tablet Take 150 mg by mouth every morning.  ? Cholecalciferol 125 MCG (5000 UT) TABS  Take 5,000 Units by mouth daily. (Patient not taking: Reported on 12/24/2021)  ? ferrous sulfate 325 (65 FE) MG tablet Take 325 mg by mouth daily with breakfast. (Patient not taking: Reported on 12/24/2021)  ? HYDROcodone-acetaminophen (NORCO/VICODIN) 5-325 MG tablet Take 1 tablet by mouth every 4 (four) hours as needed.  ? nortriptyline (PAMELOR) 10 MG capsule Take 10 mg by mouth at bedtime.  ? ondansetron (ZOFRAN-ODT) 4 MG disintegrating tablet Take 1 tablet by mouth every 8 (eight) hours as needed.  ? valACYclovir (VALTREX) 500 MG tablet Take 500 mg by mouth daily.  ? ?No facility-administered encounter medications on file as of 01/02/2022.  ? ? ?Allergy: No Known Allergies ? ?Social Hx:   ?Social History  ? ?Socioeconomic History  ? Marital status: Single  ?  Spouse name: Not on file  ? Number of children: Not on file  ? Years of education: Not on file  ? Highest education level: Not on file  ?Occupational History  ? Not on file  ?Tobacco Use  ? Smoking status: Never  ? Smokeless tobacco: Never  ?Vaping Use  ? Vaping Use: Never used  ?Substance and Sexual Activity  ? Alcohol use: Yes  ? Drug use: Never  ? Sexual activity: Not on file  ?Other Topics Concern  ? Not on file  ?Social History Narrative  ? Not on file  ? ?Social Determinants of Health  ? ?Financial Resource Strain: Not on file  ?Food Insecurity: Not on file  ?Transportation Needs: Not on file  ?Physical Activity: Not on file  ?Stress: Not on file  ?Social Connections: Not on file  ?Intimate Partner Violence: Not on file  ? ? ?Past Surgical Hx:  ?Past Surgical History:  ?Procedure Laterality Date  ? GUM SURGERY    ? Gum grafting  ? LAPAROSCOPIC ENDOMETRIOSIS FULGURATION  11/2019  ? LAPAROSCOPY  02/2020  ? omentectomy, peritoneal biopsies  ? ROBOTIC ASSISTED LAPAROSCOPIC HYSTERECTOMY AND SALPINGECTOMY N/A 11/21/2021  ? Procedure: XI ROBOTIC ASSISTED LAPAROSCOPIC HYSTERECTOMY AND Bilateral SALPINGECTOMY;  Surgeon: Lafonda Mosses, MD;  Location: [facility];   Service: Gynecology;  Laterality: N/A;  ? ? ?Past Medical Hx:  ?Past Medical History:  ?Diagnosis Date  ? Adenomyosis   ? Adenomyosis   ? Depression   ? Dysmenorrhea   ? Headache   ? Herpes genitalia   ? IBS (irritable bowel syndrome)   ? 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 ? ?Family Hx:  ?Family History  ?Problem Relation Age of Onset  ? Breast cancer Maternal Grandmother   ? Kidney cancer Maternal Grandfather   ? Cervical cancer Paternal Grandmother   ? ? ?Vitals:  Blood pressure 129/85, pulse (!) 105, temperature 98.9 ?F (37.2 ?C), temperature source Oral, resp. rate 16, height '5\' 2"'$  (1.575 m), weight 129 lb (58.5 kg). ? ?CBC ?   ?  Component Value Date/Time  ? WBC 8.0 01/02/2022 1357  ? WBC 7.2 11/14/2021 1320  ? RBC 4.12 01/02/2022 1357  ? HGB 12.3 01/02/2022 1357  ? HCT 37.1 01/02/2022 1357  ? PLT 287 01/02/2022 1357  ? MCV 90.0 01/02/2022 1357  ? MCH 29.9 01/02/2022 1357  ? MCHC 33.2 01/02/2022 1357  ? RDW 12.3 01/02/2022 1357  ? LYMPHSABS 1.9 01/02/2022 1357  ? MONOABS 0.5 01/02/2022 1357  ? EOSABS 0.2 01/02/2022 1357  ? BASOSABS 0.0 01/02/2022 1357  ?  ?BMET ?   ?Component Value Date/Time  ? NA 137 01/02/2022 1357  ? K 4.2 01/02/2022 1357  ? CL 104 01/02/2022 1357  ? CO2 24 01/02/2022 1357  ? GLUCOSE 93 01/02/2022 1357  ? BUN 6 01/02/2022 1357  ? CREATININE 0.55 01/02/2022 1357  ? CALCIUM 9.4 01/02/2022 1357  ? GFRNONAA >60 01/02/2022 1357  ?  ?Physical Exam:  ?General: Well developed, well nourished female in no acute distress. Alert and oriented x 3.   ?Cardiovascular: Tachycardic at 104 bpm. Regular rhythm. S1 and S2 normal.  ?Lungs: Clear to auscultation bilaterally. No wheezes/crackles/rhonchi noted.  ?Skin: No rashes or lesions present. Suture material visible coming from right lower abdominal incision. Removed without difficulty.  ?Abdomen: Abdomen soft, non-tender and non-obese. Active bowel sounds in all quadrants. No evidence of a fluid wave or abdominal masses. No  tenderness reported with palpation of the abdomen. ?Genitourinary:  ?  Vulva/vagina: Normal external female genitalia. No lesions.  ?  Urethra: No lesions or masses  ?  Vagina: Vaginal cuff open on spec

## 2022-01-02 NOTE — Patient Instructions (Signed)
Dr. Berline Lopes would like for you to start taking Flagyl tonight. This is an antibiotic. Do not take with alcohol because it can make you sick (vomiting).  ? ?Plan to have the procedure at [facility] in the am tomorrow, January 03, 2022 with Dr. Berline Lopes.  ? ?Until your procedure, no heavy lifting over 10 lbs, no straining, nothing in the vagina. ? ?IF BEFORE YOUR PROCEDURE TOMORROW, YOU DEVELOP NEW SYMPTOMS SUCH AS PAIN, DISCHARGE, BLEEDING, FEVER ETC, CALL THE AFTER HOURS NUMBER AT [phone]. ? ?Preparing for your Surgery ? ?Plan for surgery on January 03, 2022 with Dr. Jeral Pinch at [place] will be scheduled for examination under anesthesia, vaginal cuff exam/repair, possible diagnostic laparoscopy (small incisions on your abdomen to use a camera to look inside abdomen).  ? ?Pre-operative Testing ?-(DONE) You will receive a phone call from presurgical testing at [facility] to discuss instructions. ? ?-Bring your insurance card, copy of an advanced directive if applicable, medication list ? ?-At that visit, you will be asked to sign a consent for a possible blood transfusion in case a transfusion becomes necessary during surgery.  The need for a blood transfusion is rare but having consent is a necessary part of your care.    ? ?-You should not be taking blood thinners or aspirin at least ten days prior to surgery unless instructed by your surgeon. ? ?-Do not take supplements such as fish oil (omega 3), red yeast rice, turmeric before your surgery. You want to avoid medications with aspirin in them including headache powders such as BC or Goody's), Excedrin migraine. ? ?Day Before Surgery at Home ?NOTHING TO EAT OR DRINK AFTER MIDNIGHT TONIGHT. ? ?If your bowels are filled with gas, your surgeon will have difficulty visualizing your pelvic organs which increases your surgical risks. ? ?Your role in recovery ?Your role is to become active as soon as directed by your doctor,  while still giving yourself time to heal.  Rest when you feel tired. You will be asked to do the following in order to speed your recovery: ? ?- Cough and breathe deeply. This helps to clear and expand your lungs and can prevent pneumonia after surgery.  ?- STAY ACTIVE WHEN YOU GET HOME. Do mild physical activity. Walking or moving your legs help your circulation and body functions return to normal. Do not try to get up or walk alone the first time after surgery.   ?-If you develop swelling on one leg or the other, pain in the back of your leg, redness/warmth in one of your legs, please call the office or go to the Emergency Room to have a doppler to rule out a blood clot. For shortness of breath, chest pain-seek care in the Emergency Room as soon as possible. ?- Actively manage your pain. Managing your pain lets you move in comfort. We will ask you to rate your pain on a scale of zero to 10. It is your responsibility to tell your doctor or nurse where and how much you hurt so your pain can be treated. ? ?Risks of Surgery ?Risks of surgery are low but include bleeding, infection, damage to surrounding structures, re-operation, blood clots, and very rarely death. ? ? ?Blood Transfusion Information (For the consent to be signed before surgery) ? ?We will be checking your blood type before surgery so in case of emergencies, we will know what type of blood you would need. ? ?  WHAT IS A BLOOD TRANSFUSION? ? ?A transfusion is the replacement of blood or some of its parts. Blood is made up of multiple cells which provide different functions. ?Red blood cells carry oxygen and are used for blood loss replacement. ?White blood cells fight against infection. ?Platelets control bleeding. ?Plasma helps clot blood. ?Other blood products are available for specialized needs, such as hemophilia or other clotting disorders. ?BEFORE THE TRANSFUSION  ?Who gives blood for transfusions?  ?You may  be able to donate blood to be used at a later date on yourself (autologous donation). ?Relatives can be asked to donate blood. This is generally not any safer than if you have received blood from a stranger. The same precautions are taken to ensure safety when a relative's blood is donated. ?Healthy volunteers who are fully evaluated to make sure their blood is safe. This is blood bank blood. ?Transfusion therapy is the safest it has ever been in the practice of medicine. Before blood is taken from a donor, a complete history is taken to make sure that person has no history of diseases nor engages in risky social behavior (examples are intravenous drug use or sexual activity with multiple partners). The donor's travel history is screened to minimize risk of transmitting infections, such as malaria. The donated blood is tested for signs of infectious diseases, such as HIV and hepatitis. The blood is then tested to be sure it is compatible with you in order to minimize the chance of a transfusion reaction. If you or a relative donates blood, this is often done in anticipation of surgery and is not appropriate for emergency situations. It takes many days to process the donated blood. ?RISKS AND COMPLICATIONS ?Although transfusion therapy is very safe and saves many lives, the main dangers of transfusion include:  ?Getting an infectious disease. ?Developing a transfusion reaction. This is an allergic reaction to something in the blood you were given. Every precaution is taken to prevent this. ?The decision to have a blood transfusion has been considered carefully by your caregiver before blood is given. Blood is not given unless the benefits outweigh the risks. ? ?AFTER SURGERY INSTRUCTIONS ? ?Return to work: 6 weeks if applicable ? ?Activity: ?1. Be up and out of the bed during the day.  Take a nap if needed.  You may walk up steps but be careful and use the hand rail.  Stair climbing will tire you more than you think,  you may need to stop part way and rest.  ? ?2. No lifting or straining for 6 weeks over 10 pounds. No pushing, pulling, straining for 6 weeks. ? ?3. No driving for around 5-7 days, definitely 24 hours after having anesthesia.  Do not drive if you are taking narcotic pain medicine and make sure that your reaction time has returned.  ? ?4. You can shower as soon as the next day after surgery. Shower daily.  Use your regular soap and water (not directly on the incision) and pat your incision(s) dry afterwards; don't rub.  No tub baths or submerging your body in water until cleared by your surgeon. If you have the soap that was given to you by pre-surgical testing that was used before surgery, you do not need to use it afterwards because this can irritate your incisions.  ? ?5. No sexual activity and nothing in the vagina for 8 weeks from January 03, 2022. ? ?6. You may experience a small amount of clear drainage from your incisions, which  is normal.  If the drainage persists, increases, or changes color please call the office. ? ?7. Do not use creams, lotions, or ointments such as neosporin on your incisions after surgery until advised by your surgeon because they can cause removal of the dermabond glue on your incisions.   ? ?8. You may experience vaginal spotting after surgery or around the 6-8 week mark from surgery when the stitches at the top of the vagina begin to dissolve.  The spotting is normal but if you experience heavy bleeding, call our office. ? ?9. Take Tylenol or ibuprofen first for pain and only use narcotic pain medication for severe pain not relieved by the Tylenol or Ibuprofen.  Monitor your Tylenol intake to a max of 4,000 mg in a 24 hour period. You can alternate these medications after surgery. ? ?Diet: ?1. Low sodium Heart Healthy Diet is recommended but you are cleared to resume your normal (before surgery) diet after your procedure. ? ?2. It is safe to use a laxative, such as Miralax or Colace,  if you have difficulty moving your bowels. You will be prescribed Sennakot-S to take at bedtime every evening after surgery to keep bowel movements regular and to prevent constipation.   ? ?Wound Care: ?

## 2022-01-02 NOTE — H&P (View-Only) (Signed)
GYN Oncology Post-op Symptom Management Visit ?                                                                       ?Krista Mcintyre 26 y.o. female ? ?CC:  ?Chief Complaint  ?Patient presents with  ? Serous cystadenoma, borderline malignancy, unspecified late  ? 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 On 11/21/2021, she underwent robotic-assisted laparoscopic total hysterectomy with bilateral salpingectomy, oversew of bladder peritoneum with Dr. Jeral Pinch. 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 ? ?Final pathology resulted: ?FINAL MICROSCOPIC DIAGNOSIS:  ?A. UTERUS, CERVIX, BILATERAL TUBES, IUD, HYSTERECTOMY AND SALPINGECTOMY:  ?Uterus:  ?-  Inactive endometrium with progestin effect  ?-  No hyperplasia or malignancy identified  ?Cervix:   ?-  Low grade squamous intraepithelial lesion (CIN1, mild dysplasia)  ?-  No malignancy identified  ?Fallopian tubes, bilateral:  ?-  Benign fallopian tube  ?-  No malignancy identified  ?B. PERITONEAL NODULE, BIOPSY:  ?-  Stromal fibrosis  ?-  No malignancy identified  ? ?Post-operatively, she underwent CT AP imaging and lab work on 12/06/21 due to increasing abdominal pain. CT imaging was negative for acute abdominopelvic findings with moderate to large volume of stool in the colon. Labs were appropriate given recent surgery and pre-operative values. She was last seen in the office on December 24, 2021 for post-operative follow up with normal examination at that time.  ? ?Interval History:  ?She presents today to the office for follow-up on recent ER evaluation.  She states she had one of the busiest days she has ever had at work this past Friday.  During the night on Friday, she was woken up by deep  pelvic pain/pressure. Later in the night, she woke with vaginal bleeding.  This was not described as dark or bright and filled up half of a thin maxi pad.  On Saturday, she had minimal spotting throughout the day.  She presented to the emergency room in [place] on December 29, 2021 for further evaluation of vaginal bleeding and pelvic pain.  At this visit, she had repeat lab work with her white blood cell count at 9.8, hemoglobin 12.3, hematocrit 36.4, platelet count 246, bmet normal with creatinine at 0.80.  She also underwent CT imaging, which showed postoperative changes of a hysterectomy with small amount of fluid within the pelvis without peripherally enhancing collection to suggest abscess.  The bowel was without obstruction.  No pneumoperitoneum.  No pathologically enlarged lymph nodes.  She also underwent pelvic examination by the ER provider with findings noted as "moderate amount of blood in the vagina, cervical cuff appears to have a blood clot in the center of it, unable to tell if sutures still in place."  At discharge, she was recommended to push fluids, not place anything in the vagina, prescribed Zofran and Norco 5/325, and to follow-up with her GYN surgeon. ? ?Today she states she has had no pain or pelvic pressure.  She stopped taking her medications on Monday of  this week and states the pain has gotten better each day.  She has not had any more vaginal bleeding.  No fever or chills reported.  Bowels and bladder working without issues.  No abdominal pain or bloating. 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 ? ?Review of Systems: ?See interval HPI. Additional review of systems negative. ? ?Current Meds:  ?Outpatient Encounter Medications as of 01/02/2022  ?Medication Sig  ? buPROPion (WELLBUTRIN XL) 150 MG 24 hr tablet Take 150 mg by mouth every morning.  ? Cholecalciferol 125 MCG (5000 UT) TABS  Take 5,000 Units by mouth daily. (Patient not taking: Reported on 12/24/2021)  ? ferrous sulfate 325 (65 FE) MG tablet Take 325 mg by mouth daily with breakfast. (Patient not taking: Reported on 12/24/2021)  ? HYDROcodone-acetaminophen (NORCO/VICODIN) 5-325 MG tablet Take 1 tablet by mouth every 4 (four) hours as needed.  ? nortriptyline (PAMELOR) 10 MG capsule Take 10 mg by mouth at bedtime.  ? ondansetron (ZOFRAN-ODT) 4 MG disintegrating tablet Take 1 tablet by mouth every 8 (eight) hours as needed.  ? valACYclovir (VALTREX) 500 MG tablet Take 500 mg by mouth daily.  ? ?No facility-administered encounter medications on file as of 01/02/2022.  ? ? ?Allergy: No Known Allergies ? ?Social Hx:   ?Social History  ? ?Socioeconomic History  ? Marital status: Single  ?  Spouse name: Not on file  ? Number of children: Not on file  ? Years of education: Not on file  ? Highest education level: Not on file  ?Occupational History  ? Not on file  ?Tobacco Use  ? Smoking status: Never  ? Smokeless tobacco: Never  ?Vaping Use  ? Vaping Use: Never used  ?Substance and Sexual Activity  ? Alcohol use: Yes  ? Drug use: Never  ? Sexual activity: Not on file  ?Other Topics Concern  ? Not on file  ?Social History Narrative  ? Not on file  ? ?Social Determinants of Health  ? ?Financial Resource Strain: Not on file  ?Food Insecurity: Not on file  ?Transportation Needs: Not on file  ?Physical Activity: Not on file  ?Stress: Not on file  ?Social Connections: Not on file  ?Intimate Partner Violence: Not on file  ? ? ?Past Surgical Hx:  ?Past Surgical History:  ?Procedure Laterality Date  ? GUM SURGERY    ? Gum grafting  ? LAPAROSCOPIC ENDOMETRIOSIS FULGURATION  11/2019  ? LAPAROSCOPY  02/2020  ? omentectomy, peritoneal biopsies  ? ROBOTIC ASSISTED LAPAROSCOPIC HYSTERECTOMY AND SALPINGECTOMY N/A 11/21/2021  ? Procedure: XI ROBOTIC ASSISTED LAPAROSCOPIC HYSTERECTOMY AND Bilateral SALPINGECTOMY;  Surgeon: Lafonda Mosses, MD;  Location: [facility];   Service: Gynecology;  Laterality: N/A;  ? ? ?Past Medical Hx:  ?Past Medical History:  ?Diagnosis Date  ? Adenomyosis   ? Adenomyosis   ? Depression   ? Dysmenorrhea   ? Headache   ? Herpes genitalia   ? IBS (irritable bowel syndrome)   ? 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 ? ?Family Hx:  ?Family History  ?Problem Relation Age of Onset  ? Breast cancer Maternal Grandmother   ? Kidney cancer Maternal Grandfather   ? Cervical cancer Paternal Grandmother   ? ? ?Vitals:  Blood pressure 129/85, pulse (!) 105, temperature 98.9 ?F (37.2 ?C), temperature source Oral, resp. rate 16, height '5\' 2"'$  (1.575 m), weight 129 lb (58.5 kg). ? ?CBC ?   ?  Component Value Date/Time  ? WBC 8.0 01/02/2022 1357  ? WBC 7.2 11/14/2021 1320  ? RBC 4.12 01/02/2022 1357  ? HGB 12.3 01/02/2022 1357  ? HCT 37.1 01/02/2022 1357  ? PLT 287 01/02/2022 1357  ? MCV 90.0 01/02/2022 1357  ? MCH 29.9 01/02/2022 1357  ? MCHC 33.2 01/02/2022 1357  ? RDW 12.3 01/02/2022 1357  ? LYMPHSABS 1.9 01/02/2022 1357  ? MONOABS 0.5 01/02/2022 1357  ? EOSABS 0.2 01/02/2022 1357  ? BASOSABS 0.0 01/02/2022 1357  ?  ?BMET ?   ?Component Value Date/Time  ? NA 137 01/02/2022 1357  ? K 4.2 01/02/2022 1357  ? CL 104 01/02/2022 1357  ? CO2 24 01/02/2022 1357  ? GLUCOSE 93 01/02/2022 1357  ? BUN 6 01/02/2022 1357  ? CREATININE 0.55 01/02/2022 1357  ? CALCIUM 9.4 01/02/2022 1357  ? GFRNONAA >60 01/02/2022 1357  ?  ?Physical Exam:  ?General: Well developed, well nourished female in no acute distress. Alert and oriented x 3.   ?Cardiovascular: Tachycardic at 104 bpm. Regular rhythm. S1 and S2 normal.  ?Lungs: Clear to auscultation bilaterally. No wheezes/crackles/rhonchi noted.  ?Skin: No rashes or lesions present. Suture material visible coming from right lower abdominal incision. Removed without difficulty.  ?Abdomen: Abdomen soft, non-tender and non-obese. Active bowel sounds in all quadrants. No evidence of a fluid wave or abdominal masses. No  tenderness reported with palpation of the abdomen. ?Genitourinary:  ?  Vulva/vagina: Normal external female genitalia. No lesions.  ?  Urethra: No lesions or masses  ?  Vagina: Vaginal cuff open on spec

## 2022-01-03 ENCOUNTER — Ambulatory Visit (HOSPITAL_COMMUNITY)
Admission: RE | Admit: 2022-01-03 | Discharge: 2022-01-03 | Disposition: A | Discharge status: Home / Self Care | Payer: 59 | Attending: Gynecologic Oncology | Admitting: Gynecologic Oncology

## 2022-01-03 ENCOUNTER — Ambulatory Visit (HOSPITAL_COMMUNITY): Payer: 59 | Admitting: Certified Registered Nurse Anesthetist

## 2022-01-03 ENCOUNTER — Encounter (HOSPITAL_COMMUNITY)
Admission: RE | Disposition: A | Discharge status: Home / Self Care | Payer: Self-pay | Source: Home / Self Care | Attending: Gynecologic Oncology

## 2022-01-03 ENCOUNTER — Encounter (HOSPITAL_COMMUNITY): Payer: Self-pay | Admitting: Gynecologic Oncology

## 2022-01-03 ENCOUNTER — Ambulatory Visit (HOSPITAL_BASED_OUTPATIENT_CLINIC_OR_DEPARTMENT_OTHER): Payer: 59 | Admitting: Certified Registered Nurse Anesthetist

## 2022-01-03 ENCOUNTER — Other Ambulatory Visit: Payer: Self-pay

## 2022-01-03 DIAGNOSIS — T8132XA Disruption of internal operation (surgical) wound, not elsewhere classified, initial encounter: Secondary | ICD-10-CM | POA: Diagnosis present

## 2022-01-03 DIAGNOSIS — Z9079 Acquired absence of other genital organ(s): Secondary | ICD-10-CM | POA: Diagnosis not present

## 2022-01-03 DIAGNOSIS — K589 Irritable bowel syndrome without diarrhea: Secondary | ICD-10-CM | POA: Insufficient documentation

## 2022-01-03 DIAGNOSIS — Z79899 Other long term (current) drug therapy: Secondary | ICD-10-CM | POA: Insufficient documentation

## 2022-01-03 DIAGNOSIS — T8131XA Disruption of external operation (surgical) wound, not elsewhere classified, initial encounter: Secondary | ICD-10-CM

## 2022-01-03 DIAGNOSIS — F32A Depression, unspecified: Secondary | ICD-10-CM | POA: Insufficient documentation

## 2022-01-03 DIAGNOSIS — X58XXXA Exposure to other specified factors, initial encounter: Secondary | ICD-10-CM | POA: Diagnosis not present

## 2022-01-03 LAB — TYPE AND SCREEN
ABO/RH(D): A POS
Antibody Screen: NEGATIVE

## 2022-01-03 SURGERY — EXAM UNDER ANESTHESIA
Anesthesia: General | Site: Vagina

## 2022-01-03 MED ORDER — SUGAMMADEX SODIUM 200 MG/2ML IV SOLN
INTRAVENOUS | Status: DC | PRN
  Administered 2022-01-03: 200 mg via INTRAVENOUS

## 2022-01-03 MED ORDER — ROCURONIUM BROMIDE 10 MG/ML (PF) SYRINGE
PREFILLED_SYRINGE | INTRAVENOUS | Status: AC
  Filled 2022-01-03: qty 10

## 2022-01-03 MED ORDER — SCOPOLAMINE 1 MG/3DAYS TD PT72
1.0000 | MEDICATED_PATCH | TRANSDERMAL | Status: DC
  Administered 2022-01-03: 1.5 mg via TRANSDERMAL
  Filled 2022-01-03: qty 1

## 2022-01-03 MED ORDER — CEFAZOLIN SODIUM-DEXTROSE 2-4 GM/100ML-% IV SOLN
2.0000 g | INTRAVENOUS | Status: AC
  Administered 2022-01-03: 2 g via INTRAVENOUS
  Filled 2022-01-03: qty 100

## 2022-01-03 MED ORDER — OXYCODONE HCL 5 MG/5ML PO SOLN: 5.0000 mg | Freq: Once | ORAL | Status: DC | PRN

## 2022-01-03 MED ORDER — ORAL CARE MOUTH RINSE: 15.0000 mL | Freq: Once | OROMUCOSAL | Status: AC

## 2022-01-03 MED ORDER — ROCURONIUM BROMIDE 10 MG/ML (PF) SYRINGE
PREFILLED_SYRINGE | INTRAVENOUS | Status: DC | PRN
  Administered 2022-01-03: 50 mg via INTRAVENOUS

## 2022-01-03 MED ORDER — DEXAMETHASONE SODIUM PHOSPHATE 10 MG/ML IJ SOLN
INTRAMUSCULAR | Status: AC
  Filled 2022-01-03: qty 1

## 2022-01-03 MED ORDER — LIDOCAINE HCL (PF) 2 % IJ SOLN
INTRAMUSCULAR | Status: AC
  Filled 2022-01-03: qty 5

## 2022-01-03 MED ORDER — BUPIVACAINE HCL 0.25 % IJ SOLN
INTRAMUSCULAR | Status: AC
  Filled 2022-01-03: qty 1

## 2022-01-03 MED ORDER — MIDAZOLAM HCL 2 MG/2ML IJ SOLN
INTRAMUSCULAR | Status: AC
  Filled 2022-01-03: qty 2

## 2022-01-03 MED ORDER — HYDROMORPHONE HCL 1 MG/ML IJ SOLN: 0.2500 mg | INTRAMUSCULAR | Status: DC | PRN

## 2022-01-03 MED ORDER — ONDANSETRON HCL 4 MG/2ML IJ SOLN
INTRAMUSCULAR | Status: DC | PRN
  Administered 2022-01-03: 4 mg via INTRAVENOUS

## 2022-01-03 MED ORDER — FENTANYL CITRATE (PF) 100 MCG/2ML IJ SOLN
INTRAMUSCULAR | Status: AC
Start: 2022-01-03 — End: ?
  Filled 2022-01-03: qty 2

## 2022-01-03 MED ORDER — MIDAZOLAM HCL 5 MG/5ML IJ SOLN
INTRAMUSCULAR | Status: DC | PRN
Start: 2022-01-03 — End: 2022-01-03
  Administered 2022-01-03: 2 mg via INTRAVENOUS

## 2022-01-03 MED ORDER — PROPOFOL 10 MG/ML IV BOLUS
INTRAVENOUS | Status: AC
  Filled 2022-01-03: qty 20

## 2022-01-03 MED ORDER — MIDAZOLAM HCL 2 MG/2ML IJ SOLN: 0.5000 mg | Freq: Once | INTRAMUSCULAR | Status: DC | PRN

## 2022-01-03 MED ORDER — CELECOXIB 200 MG PO CAPS
200.0000 mg | ORAL_CAPSULE | ORAL | Status: AC
  Administered 2022-01-03: 200 mg via ORAL
  Filled 2022-01-03: qty 1

## 2022-01-03 MED ORDER — ONDANSETRON HCL 4 MG/2ML IJ SOLN
INTRAMUSCULAR | Status: AC
  Filled 2022-01-03: qty 2

## 2022-01-03 MED ORDER — FENTANYL CITRATE (PF) 100 MCG/2ML IJ SOLN
INTRAMUSCULAR | Status: AC
  Filled 2022-01-03: qty 2

## 2022-01-03 MED ORDER — GABAPENTIN 300 MG PO CAPS
300.0000 mg | ORAL_CAPSULE | ORAL | Status: AC
  Administered 2022-01-03: 300 mg via ORAL
  Filled 2022-01-03: qty 1

## 2022-01-03 MED ORDER — OXYCODONE HCL 5 MG PO TABS: 5.0000 mg | ORAL_TABLET | Freq: Once | ORAL | Status: DC | PRN

## 2022-01-03 MED ORDER — DEXAMETHASONE SODIUM PHOSPHATE 4 MG/ML IJ SOLN
INTRAMUSCULAR | Status: DC | PRN
  Administered 2022-01-03: 10 mg via INTRAVENOUS

## 2022-01-03 MED ORDER — DEXAMETHASONE SODIUM PHOSPHATE 4 MG/ML IJ SOLN: 4.0000 mg | INTRAMUSCULAR | Status: DC

## 2022-01-03 MED ORDER — 0.9 % SODIUM CHLORIDE (POUR BTL) OPTIME
TOPICAL | Status: DC | PRN
  Administered 2022-01-03: 1000 mL

## 2022-01-03 MED ORDER — MEPERIDINE HCL 50 MG/ML IJ SOLN: 6.2500 mg | INTRAMUSCULAR | Status: DC | PRN

## 2022-01-03 MED ORDER — PROPOFOL 10 MG/ML IV BOLUS
INTRAVENOUS | Status: DC | PRN
Start: 2022-01-03 — End: 2022-01-03
  Administered 2022-01-03: 140 mg via INTRAVENOUS

## 2022-01-03 MED ORDER — LACTATED RINGERS IV SOLN: INTRAVENOUS | Status: DC

## 2022-01-03 MED ORDER — FENTANYL CITRATE (PF) 100 MCG/2ML IJ SOLN
INTRAMUSCULAR | Status: DC | PRN
Start: 2022-01-03 — End: 2022-01-03
  Administered 2022-01-03: 50 ug via INTRAVENOUS
  Administered 2022-01-03: 100 ug via INTRAVENOUS
  Administered 2022-01-03: 50 ug via INTRAVENOUS

## 2022-01-03 MED ORDER — ACETAMINOPHEN 500 MG PO TABS
1000.0000 mg | ORAL_TABLET | Freq: Once | ORAL | Status: AC
  Administered 2022-01-03: 1000 mg via ORAL
  Filled 2022-01-03: qty 2

## 2022-01-03 MED ORDER — LIDOCAINE 2% (20 MG/ML) 5 ML SYRINGE
INTRAMUSCULAR | Status: DC | PRN
  Administered 2022-01-03: 40 mg via INTRAVENOUS

## 2022-01-03 MED ORDER — CHLORHEXIDINE GLUCONATE 0.12 % MT SOLN
15.0000 mL | Freq: Once | OROMUCOSAL | Status: AC
  Administered 2022-01-03: 15 mL via OROMUCOSAL

## 2022-01-03 SURGICAL SUPPLY — 48 items
BACTOSHIELD CHG 4% 4OZ (MISCELLANEOUS)
BAG COUNTER SPONGE SURGICOUNT (BAG) IMPLANT
BAG RETRIEVAL 10 (BASKET)
CABLE HIGH FREQUENCY MONO STRZ (ELECTRODE) IMPLANT
CHLORAPREP W/TINT 26 (MISCELLANEOUS) ×1 IMPLANT
CNTNR URN SCR LID CUP LEK RST (MISCELLANEOUS) IMPLANT
CONT SPEC 4OZ STRL OR WHT (MISCELLANEOUS)
COVER SURGICAL LIGHT HANDLE (MISCELLANEOUS) ×3 IMPLANT
DERMABOND ADVANCED (GAUZE/BANDAGES/DRESSINGS)
DERMABOND ADVANCED .7 DNX12 (GAUZE/BANDAGES/DRESSINGS) ×1 IMPLANT
DRAPE SHEET LG 3/4 BI-LAMINATE (DRAPES) ×2 IMPLANT
ELECT REM PT RETURN 15FT ADLT (MISCELLANEOUS) ×3 IMPLANT
GLOVE SURG ENC MOIS LTX SZ6 (GLOVE) ×6 IMPLANT
GLOVE SURG ENC MOIS LTX SZ6.5 (GLOVE) ×6 IMPLANT
GOWN STRL REUS W/ TWL LRG LVL3 (GOWN DISPOSABLE) ×5 IMPLANT
GOWN STRL REUS W/TWL LRG LVL3 (GOWN DISPOSABLE) ×3
HOLDER FOLEY CATH W/STRAP (MISCELLANEOUS) IMPLANT
IRRIG SUCT STRYKERFLOW 2 WTIP (MISCELLANEOUS)
IRRIGATION SUCT STRKRFLW 2 WTP (MISCELLANEOUS) IMPLANT
KIT BASIN OR (CUSTOM PROCEDURE TRAY) ×3 IMPLANT
KIT TURNOVER KIT A (KITS) ×2 IMPLANT
MANIPULATOR UTERINE 4.5 ZUMI (MISCELLANEOUS) IMPLANT
PAD POSITIONING PINK XL (MISCELLANEOUS) ×3 IMPLANT
SCISSORS LAP 5X35 DISP (ENDOMECHANICALS) IMPLANT
SCRUB CHG 4% DYNA-HEX 4OZ (MISCELLANEOUS) ×1 IMPLANT
SEALER TISSUE G2 CVD JAW 45CM (ENDOMECHANICALS) IMPLANT
SHEET LAVH (DRAPES) ×3 IMPLANT
SLEEVE XCEL OPT CAN 5 100 (ENDOMECHANICALS) ×1 IMPLANT
SOL PREP POV-IOD 4OZ 10% (MISCELLANEOUS) ×4 IMPLANT
SPIKE FLUID TRANSFER (MISCELLANEOUS) IMPLANT
SUT MNCRL AB 4-0 PS2 18 (SUTURE) ×4 IMPLANT
SUT PDS AB 0 CT1 36 (SUTURE) ×6 IMPLANT
SUT VIC AB 0 CT1 18XCR BRD 8 (SUTURE) ×1 IMPLANT
SUT VIC AB 0 CT1 27 (SUTURE) ×2
SUT VIC AB 0 CT1 27XBRD ANTBC (SUTURE) ×2 IMPLANT
SUT VIC AB 0 CT1 8-18 (SUTURE) ×1
SYS BAG RETRIEVAL 10MM (BASKET)
SYS RETRIEVAL 5MM INZII UNIV (BASKET)
SYSTEM BAG RETRIEVAL 10MM (BASKET) IMPLANT
SYSTEM RETRIEVL 5MM INZII UNIV (BASKET) IMPLANT
TOWEL OR 17X26 10 PK STRL BLUE (TOWEL DISPOSABLE) ×3 IMPLANT
TOWEL OR NON WOVEN STRL DISP B (DISPOSABLE) ×3 IMPLANT
TRAY FOLEY MTR SLVR 16FR STAT (SET/KITS/TRAYS/PACK) ×1 IMPLANT
TRAY LAPAROSCOPIC (CUSTOM PROCEDURE TRAY) ×3 IMPLANT
TROCAR BLADELESS OPT 5 100 (ENDOMECHANICALS) ×1 IMPLANT
TROCAR XCEL 12X100 BLDLESS (ENDOMECHANICALS) IMPLANT
TROCAR XCEL BLUNT TIP 100MML (ENDOMECHANICALS) IMPLANT
YANKAUER SUCT BULB TIP 10FT TU (MISCELLANEOUS) ×2 IMPLANT

## 2022-01-03 NOTE — Anesthesia Postprocedure Evaluation (Signed)
Anesthesia Post Note ? ?Patient: Krista Mcintyre ? ?Procedure(s) Performed: EXAM UNDER ANESTHESIA, VAGINAL CUFF EXAM/REPAIR (Vagina ) ? ?  ? ?Patient location during evaluation: Phase II ?Anesthesia Type: General ?Level of consciousness: awake and alert, oriented and patient cooperative ?Pain management: pain level controlled ?Vital Signs Assessment: post-procedure vital signs reviewed and stable ?Respiratory status: spontaneous breathing, nonlabored ventilation and respiratory function stable ?Cardiovascular status: blood pressure returned to baseline and stable ?Postop Assessment: no apparent nausea or vomiting, adequate PO intake and able to ambulate ?Anesthetic complications: no ? ? ?No notable events documented. ? ?Last Vitals:  ?Vitals:  ? 01/03/22 0845 01/03/22 0900  ?BP: 117/64 98/79  ?Pulse: 96 (!) 116  ?Resp: 12 (!) 29  ?Temp:    ?SpO2: 100% 98%  ?  ?Last Pain:  ?Vitals:  ? 01/03/22 0836  ?TempSrc:   ?PainSc: 0-No pain  ? ? ?  ?  ?  ?  ?  ?  ? ?Kandice Schmelter,E. Chelan Heringer ? ? ? ? ?

## 2022-01-03 NOTE — Discharge Instructions (Addendum)
AFTER SURGERY INSTRUCTIONS ?  ?Return to work: 6 weeks if applicable ?  ?Activity: ?1. Be up and out of the bed during the day.  Take a nap if needed.  You may walk up steps but be careful and use the hand rail.  Stair climbing will tire you more than you think, you may need to stop part way and rest.  ?  ?2. No lifting or straining for 6 weeks over 10 pounds. No pushing, pulling, straining for 6 weeks. ?  ?3. No driving for around 5-7 days, definitely 24 hours after having anesthesia.  Do not drive if you are taking narcotic pain medicine and make sure that your reaction time has returned.  ?  ?4. You can shower as soon as the next day after surgery. Shower daily.  Use your regular soap and water (not directly on the incision) and pat your incision(s) dry afterwards; don't rub.  No tub baths or submerging your body in water until cleared by your surgeon. If you have the soap that was given to you by pre-surgical testing that was used before surgery, you do not need to use it afterwards because this can irritate your incisions.  ?  ?5. No sexual activity and nothing in the vagina for 8 weeks from January 03, 2022. ?  ?6. You may experience a small amount of clear drainage from your incisions, which is normal.  If the drainage persists, increases, or changes color please call the office. ?  ?7. Do not use creams, lotions, or ointments such as neosporin on your incisions after surgery until advised by your surgeon because they can cause removal of the dermabond glue on your incisions.   ?  ?8. You may experience vaginal spotting after surgery or around the 6-8 week mark from surgery when the stitches at the top of the vagina begin to dissolve.  The spotting is normal but if you experience heavy bleeding, call our office. ?  ?9. Take Tylenol or ibuprofen first for pain and only use narcotic pain medication for severe pain not relieved by the Tylenol or Ibuprofen.  Monitor your Tylenol intake to a max of 4,000 mg in a 24  hour period. You can alternate these medications after surgery. ?  ?Diet: ?1. Low sodium Heart Healthy Diet is recommended but you are cleared to resume your normal (before surgery) diet after your procedure. ?  ?2. It is safe to use a laxative, such as Miralax or Colace, if you have difficulty moving your bowels. You will be prescribed Sennakot-S to take at bedtime every evening after surgery to keep bowel movements regular and to prevent constipation.   ?  ?Wound Care: ?1. Keep clean and dry.  Shower daily. ?  ?Reasons to call the Doctor: ?Fever - Oral temperature greater than 100.4 degrees Fahrenheit ?Foul-smelling vaginal discharge ?Difficulty urinating ?Nausea and vomiting ?Increased pain at the site of the incision that is unrelieved with pain medicine. ?Difficulty breathing with or without chest pain ?New calf pain especially if only on one side ?Sudden, continuing increased vaginal bleeding with or without clots. ?  ?Contacts: ?For questions or concerns you should contact: ?  ?Dr. Jeral Pinch at [phone] ?  ?Joylene John, NP at [phone] ?  ?After Hours: call [phone] and have the GYN Oncologist paged/contacted (after 5 pm or on the weekends). ?  ?Messages sent via mychart are for non-urgent matters and are not responded to after hours so for urgent needs, please call the after  hours number. ?  ?

## 2022-01-03 NOTE — Anesthesia Procedure Notes (Signed)
Procedure Name: Intubation ?Date/Time: 01/03/2022 7:45 AM ?Performed by: Claudia Desanctis, CRNA ?Pre-anesthesia Checklist: Patient identified, Emergency Drugs available, Suction available and Patient being monitored ?Patient Re-evaluated:Patient Re-evaluated prior to induction ?Oxygen Delivery Method: Circle system utilized ?Preoxygenation: Pre-oxygenation with 100% oxygen ?Induction Type: IV induction ?Ventilation: Mask ventilation without difficulty ?Laryngoscope Size: 2 and Miller ?Grade View: Grade I ?Tube type: Oral ?Tube size: 7.0 mm ?Number of attempts: 1 ?Airway Equipment and Method: Stylet ?Placement Confirmation: ETT inserted through vocal cords under direct vision, positive ETCO2 and breath sounds checked- equal and bilateral ?Secured at: 21 cm ?Tube secured with: Tape ?Dental Injury: Teeth and Oropharynx as per pre-operative assessment  ? ? ? ? ?

## 2022-01-03 NOTE — Transfer of Care (Signed)
Immediate Anesthesia Transfer of Care Note ? ?Patient: Krista Mcintyre ? ?Procedure(s) Performed: EXAM UNDER ANESTHESIA, VAGINAL CUFF EXAM/REPAIR (Vagina ) ? ?Patient Location: PACU ? ?Anesthesia Type:General ? ?Level of Consciousness: awake and patient cooperative ? ?Airway & Oxygen Therapy: Patient Spontanous Breathing and Patient connected to face mask ? ?Post-op Assessment: Report given to RN and Post -op Vital signs reviewed and stable ? ?Post vital signs: Reviewed and stable ? ?Last Vitals:  ?Vitals Value Taken Time  ?BP 132/75 01/03/22 0836  ?Temp    ?Pulse 111 01/03/22 0841  ?Resp 12 01/03/22 0841  ?SpO2 100 % 01/03/22 0841  ?Vitals shown include unvalidated device data. ? ?Last Pain:  ?Vitals:  ? 01/03/22 0611  ?TempSrc:   ?PainSc: 0-No pain  ?   ? ?  ? ?Complications: No notable events documented. ?

## 2022-01-03 NOTE — Interval H&P Note (Signed)
History and Physical Interval Note: ?Patient seen and examined with me yesterday. Seen again this morning. Symptomatically, feeling much better since last weekend. No vaginal bleeding or pelvic pain. Cuff dehiscence suspected to have happened in the setting of heavy lifting and other activities prior to full recovery. Offered expectant management versus surgical cuff closure. Given 2 hours away from hospital, patient amenable to vaginal cuff closure under anesthesia. Will presumptively treat for BV as well, Flagyl started last night. ?Jeral Pinch MD ?Gynecologic Oncology ? ? ?01/03/2022 ?7:16 AM ? ?[name]  has presented today for surgery, with the diagnosis of VAGINAL CUFF DEHISCENCE.  The various methods of treatment have been discussed with the patient and family. After consideration of risks, benefits and other options for treatment, the patient has consented to  Procedure(s): ?EXAM UNDER ANESTHESIA, VAGINAL CUFF EXAM/REPAIR (N/A) ?POSSIBLE LAPAROSCOPY DIAGNOSTIC DO NOT OPEN (N/A) as a surgical intervention.  The patient's history has been reviewed, patient examined, no change in status, stable for surgery.  I have reviewed the patient's chart and labs.  Questions were answered to the patient's satisfaction.   ? ? ?Lafonda Mosses ? ? ?

## 2022-01-03 NOTE — Op Note (Signed)
OPERATIVE NOTE ? ?PATIENT: Krista Mcintyre ? ?ENCOUNTER DATE: 01/03/22 ? ?Preop Diagnosis: Vaginal cuff dehiscence ? ?Postoperative Diagnosis: same as above ? ?Surgery: EUA, vaginal cuff repair ? ?Surgeons:  Valarie Cones, MD ? ?Assistant: Joylene John, NP ? ?Anesthesia: General  ? ?Estimated blood loss: 20 ml ? ?IVF:  see I&O flowsheet  ? ?Urine output: n/a ? ?Complications: None apparent ? ?Pathology: none ? ?Operative findings: Vaginal cuff open with fibrinous tissue noted and closed peritoneum (no bowel seen). Healthy, oozing tissue. ? ?Procedure: The patient was identified in the preoperative holding area. Informed consent was signed on the chart. Patient was seen history was reviewed and exam was performed.  ? ?The patient was then taken to the operating room and placed in the supine position with SCD hose on. General anesthesia was then induced without difficulty. She was then placed in the dorsolithotomy position. The perineum was prepped with Betadine. The vagina was gently prepped with Betadine. The patient was then draped after the prep was dried. ? ?Timeout was performed the patient, procedure, antibiotic, allergy, and length of procedure.  ? ?The weighted speculum was placed in the posterior vagina. Deaver was place in the anterior vagina and the cuff was well visualized. Figure of eight stitches were placed with 0 Vicryl at each apex. Three figure of eight stitches were placed along the mid portion of the cuff with 0 PDS.  All sutures were tied down with good hemostasis and cuff closure noted.  ? ?The vagina was irrigated and swabbed again with betadine. ? ?Normal vaginal and rectal exam noted at the completion of procedure. ? ?All instrument, suture, laparotomy, Ray-Tec, and needle counts were correct x2. The patient tolerated the procedure well and was taken recovery room in stable condition.  ? ?Lafonda Mosses, MD ? ?

## 2022-01-04 ENCOUNTER — Telehealth: Payer: Self-pay

## 2022-01-04 NOTE — Telephone Encounter (Signed)
Spoke with Ms. Marley this morning. She states she is eating, drinking and urinating well. She has had a BM and passing gas. She is taking senokot as prescribed and encouraged her to drink plenty of water. She denies fever or chills. She rates her pain 1/10. Her pain is controlled with Ibuprofen.   ? ?Instructed to call office with any fever, chills, purulent drainage, uncontrolled pain or any other questions or concerns. Patient verbalizes understanding.  ? ?Pt aware of post op appointments as well as the office number [phone] and after hours number [phone] to call if she has any questions or concerns  ?

## 2022-01-10 ENCOUNTER — Encounter: Payer: Self-pay | Admitting: Gynecologic Oncology

## 2022-01-10 ENCOUNTER — Inpatient Hospital Stay (HOSPITAL_BASED_OUTPATIENT_CLINIC_OR_DEPARTMENT_OTHER): Payer: 59 | Admitting: Gynecologic Oncology

## 2022-01-10 DIAGNOSIS — Z90710 Acquired absence of both cervix and uterus: Secondary | ICD-10-CM

## 2022-01-10 DIAGNOSIS — T8131XD Disruption of external operation (surgical) wound, not elsewhere classified, subsequent encounter: Secondary | ICD-10-CM

## 2022-01-10 DIAGNOSIS — Z90722 Acquired absence of ovaries, bilateral: Secondary | ICD-10-CM

## 2022-01-10 NOTE — Progress Notes (Signed)
Gynecologic Oncology Telehealth Consult Note: Gyn-Onc ? ?I connected with Krista Mcintyre on 01/10/22 at  4:40 PM EDT by telephone and verified that I am speaking with the correct person using two identifiers. ? ?I discussed the limitations, risks, security and privacy concerns of performing an evaluation and management service by telemedicine and the availability of in-person appointments. I also discussed with the patient that there may be a patient responsible charge related to this service. The patient expressed understanding and agreed to proceed. ? ?Other persons participating in the visit and their role in the encounter: None. ? ?Patient's location: [facility] ?Provider's location: [place] [facility] ? ?Reason for Visit: Follow-up after recent surgery ? ?Treatment History: ?01/03/2022: EUA and vaginal cuff repair after patient presented with vaginal cuff dehiscence ? ?Interval History: ?Patient reports doing well.  Denies any pelvic or vaginal pain since surgery.  Denies any vaginal bleeding or discharge.  Endorses regular bowel and bladder function.  Currently at a museum in [place]. ? ?Past Medical/Surgical History: ?Past Medical History:  ?Diagnosis Date  ? Adenomyosis   ? Adenomyosis   ? Depression   ? Dysmenorrhea   ? Headache   ? Herpes genitalia   ? IBS (irritable bowel syndrome)   ? 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 ? ?Past Surgical History:  ?Procedure Laterality Date  ? GUM SURGERY    ? Gum grafting  ? LAPAROSCOPIC ENDOMETRIOSIS FULGURATION  11/2019  ? LAPAROSCOPY  02/2020  ? omentectomy, peritoneal biopsies  ? ROBOTIC ASSISTED LAPAROSCOPIC HYSTERECTOMY AND SALPINGECTOMY N/A 11/21/2021  ? Procedure: XI ROBOTIC ASSISTED LAPAROSCOPIC HYSTERECTOMY AND Bilateral SALPINGECTOMY;  Surgeon: Lafonda Mosses, MD;  Location: [facility];  Service: Gynecology;  Laterality: N/A;  ? ? ?Family History  ?Problem Relation Age of Onset  ? Breast cancer Maternal  Grandmother   ? Kidney cancer Maternal Grandfather   ? Cervical cancer Paternal Grandmother   ? ? ?Social History  ? ?Socioeconomic History  ? Marital status: Single  ?  Spouse name: Not on file  ? Number of children: Not on file  ? Years of education: Not on file  ? Highest education level: Not on file  ?Occupational History  ? Not on file  ?Tobacco Use  ? Smoking status: Never  ? Smokeless tobacco: Never  ?Vaping Use  ? Vaping Use: Never used  ?Substance and Sexual Activity  ? Alcohol use: Yes  ? Drug use: Never  ? Sexual activity: Not on file  ?Other Topics Concern  ? Not on file  ?Social History Narrative  ? Not on file  ? ?Social Determinants of Health  ? ?Financial Resource Strain: Not on file  ?Food Insecurity: Not on file  ?Transportation Needs: Not on file  ?Physical Activity: Not on file  ?Stress: Not on file  ?Social Connections: Not on file  ? ? ?Current Medications: ? ?Current Outpatient Medications:  ?  buPROPion (WELLBUTRIN XL) 150 MG 24 hr tablet, Take 150 mg by mouth every morning., Disp: , Rfl:  ?  Cholecalciferol 125 MCG (5000 UT) TABS, Take 5,000 Units by mouth daily., Disp: , Rfl:  ?  ferrous sulfate 325 (65 FE) MG tablet, Take 325 mg by mouth daily with breakfast., Disp: , Rfl:  ?  HYDROcodone-acetaminophen (NORCO/VICODIN) 5-325 MG tablet, Take 1 tablet by mouth every 4 (four) hours as needed., Disp: , Rfl:  ?  metroNIDAZOLE (FLAGYL) 500 MG tablet, Take 1 tablet (500 mg total) by mouth 3 (  three) times daily., Disp: 15 tablet, Rfl: 0 ?  nortriptyline (PAMELOR) 10 MG capsule, Take 10 mg by mouth at bedtime., Disp: , Rfl:  ?  ondansetron (ZOFRAN-ODT) 4 MG disintegrating tablet, Take 1 tablet by mouth every 8 (eight) hours as needed., Disp: , Rfl:  ?  valACYclovir (VALTREX) 500 MG tablet, Take 500 mg by mouth daily., Disp: , Rfl:  ? ?Review of Symptoms: ?Pertinent positives and negatives as per HPI. ? ?Physical Exam: ?There were no vitals taken for this visit. ?Deferred given limitations of phone  visit. ? ?Laboratory & Radiologic Studies: ?None new ? ?Assessment & Plan: ?Krista Mcintyre is a 26 y.o. 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 ? ?Patient is doing well from a postoperative standpoint meeting milestones.  Discussed very strict restrictions in terms of pelvic rest and heavy lifting.  Patient is continuing to take time off of work.  Reviewed postoperative expectations. ? ?I discussed the assessment and treatment plan with the patient. The patient was provided with an opportunity to ask questions and all were answered. The patient agreed with the plan and demonstrated an understanding of the instructions.  ? ?The patient was advised to call back or see an in-person evaluation if the symptoms worsen or if the condition fails to improve as anticipated.  ? ?8 minutes of total time was spent for this patient encounter, including preparation, face-to-face counseling with the patient and coordination of care, and documentation of the encounter. ? ? ?Jeral Pinch, MD  ?Division of Gynecologic Oncology  ?Department of Obstetrics and Gynecology  ?[facility]  ? ?

## 2022-01-29 ENCOUNTER — Encounter: Payer: Self-pay | Admitting: Gynecologic Oncology

## 2022-02-01 ENCOUNTER — Inpatient Hospital Stay: Payer: 59 | Attending: Gynecologic Oncology | Admitting: Gynecologic Oncology

## 2022-02-01 ENCOUNTER — Other Ambulatory Visit: Payer: Self-pay

## 2022-02-01 VITALS — BP 125/75 | HR 102 | Temp 97.1°F | Resp 16 | Ht 62.01 in | Wt 129.0 lb

## 2022-02-01 DIAGNOSIS — Z90722 Acquired absence of ovaries, bilateral: Secondary | ICD-10-CM

## 2022-02-01 DIAGNOSIS — C569 Malignant neoplasm of unspecified ovary: Secondary | ICD-10-CM

## 2022-02-01 DIAGNOSIS — T8131XD Disruption of external operation (surgical) wound, not elsewhere classified, subsequent encounter: Secondary | ICD-10-CM

## 2022-02-01 DIAGNOSIS — Z90710 Acquired absence of both cervix and uterus: Secondary | ICD-10-CM

## 2022-02-01 DIAGNOSIS — R000 Tachycardia, unspecified: Secondary | ICD-10-CM | POA: Insufficient documentation

## 2022-02-01 DIAGNOSIS — Z7189 Other specified counseling: Secondary | ICD-10-CM

## 2022-02-01 NOTE — Progress Notes (Signed)
Gynecologic Oncology Return Clinic Visit ? ?02/01/22 ? ?Reason for Visit: follow-up after surgery due to vaginal cuff dehiscence ? ?Treatment History: ?Early 2021:  initially seen by the [facility] service at [facility] [place] for chronic pelvic pain. Management with continuous OCP was unhelpful as was duloxetine. Endometriosis was suspected and she was consented for laparoscopic fulguration and IUD placement.  ?12/03/2019 underwent operative laparoscopy for excision of endometriosis and placement of mIUD with MIS, biopsy of adhesion and pelvic side wall implant. Findings from surgery - "3cm area over the right pelvic sidewall with a vesicular plaque, visibly suspicious for endometriosis, just medial to the right ureter. 1cm area of similar appearing plaque over the left ureter. Bilateral ureters visualized retroperitoneally and lateralized from the operative sites. Otherwise normal posterior cul de sac. All visible disease excised."  ?Final path returned serous borderline tumor.  ?Referred to gyn oncology. ?01/11/2020 case discussed in tumor board recommendation with CT abdomen pelvis and diagnostic laparoscopy with washing and peritoneal biopsy with omentectomy was recommended. ?01/19/2020 CT scan revealed no visible disease or enlarged lymph nodes. ?02/2020: Reoperation with Dr. Cecil Cranker ([place]) for pelvic washings, omentectomy, and peritoneal stripping with biopsies. Cytology was positive for neoplastic cells but all other biopsies were negative.  ?04/2020: seen at [facility] for follow-up. ?Moved to [place], [place] ?07/2020: Established care at [facility]. CA-125 was 8. ?06/19/2021: CA- 125 was 15.6. ?06/19/2021: Pelvic ultrasound exam showed normal sonographic appearance of the ovaries, no masses or free fluid.  Normal uterus and endometrium, IUD in correct position. ?11/21/2021: Total robotic hysterectomy with bilateral salpingectomy, oversew of bladder peritoneum.  Final pathology revealed inactive endometrium with progesterone effect.  Cervix  with low-grade squamous intraepithelial lesion, no malignancy.  Bilateral fallopian tubes normal in appearance.  Peritoneal nodule biopsy with stromal fibrosis, no malignancy.  Washings showed atypical mesothelial cells. ? ?Patient was seen postoperatively on 01/02/2022, noted to have vaginal cuff dehiscence in the setting of heavy lifting at work.  On 3/23, the patient underwent exam under anesthesia and vaginal cuff repair. ? ?Interval History: ?Reports doing well.  Denies any pain after surgery.  Denies any vaginal bleeding or discharge.  Has been very careful about no heavy lifting.  Reports normal bowel and bladder function. ? ?Past Medical/Surgical History: ?Past Medical History:  ?Diagnosis Date  ? Adenomyosis   ? Adenomyosis   ? Depression   ? Dysmenorrhea   ? Headache   ? Herpes genitalia   ? IBS (irritable bowel syndrome)   ? 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 ? ?Past Surgical History:  ?Procedure Laterality Date  ? GUM SURGERY    ? Gum grafting  ? LAPAROSCOPIC ENDOMETRIOSIS FULGURATION  11/2019  ? LAPAROSCOPY  02/2020  ? omentectomy, peritoneal biopsies  ? ROBOTIC ASSISTED LAPAROSCOPIC HYSTERECTOMY AND SALPINGECTOMY N/A 11/21/2021  ? Procedure: XI ROBOTIC ASSISTED LAPAROSCOPIC HYSTERECTOMY AND Bilateral SALPINGECTOMY;  Surgeon: Lafonda Mosses, MD;  Location: [facility];  Service: Gynecology;  Laterality: N/A;  ? ? ?Family History  ?Problem Relation Age of Onset  ? Breast cancer Maternal Grandmother   ? Kidney cancer Maternal Grandfather   ? Cervical cancer Paternal Grandmother   ? ? ?Social History  ? ?Socioeconomic History  ? Marital status: Single  ?  Spouse name: Not on file  ? Number of children: Not on file  ? Years of education: Not on file  ? Highest education level: Not on file  ?Occupational History  ? Not on file  ?Tobacco Use  ?  Smoking status: Never  ? Smokeless tobacco: Never  ?Vaping Use  ? Vaping Use: Never used  ?Substance and Sexual Activity  ? Alcohol  use: Yes  ? Drug use: Never  ? Sexual activity: Not on file  ?Other Topics Concern  ? Not on file  ?Social History Narrative  ? Not on file  ? ?Social Determinants of Health  ? ?Financial Resource Strain: Not on file  ?Food Insecurity: Not on file  ?Transportation Needs: Not on file  ?Physical Activity: Not on file  ?Stress: Not on file  ?Social Connections: Not on file  ? ? ?Current Medications: ? ?Current Outpatient Medications:  ?  buPROPion (WELLBUTRIN XL) 150 MG 24 hr tablet, Take 150 mg by mouth every morning., Disp: , Rfl:  ?  Cholecalciferol 125 MCG (5000 UT) TABS, Take 5,000 Units by mouth daily., Disp: , Rfl:  ?  ferrous sulfate 325 (65 FE) MG tablet, Take 325 mg by mouth daily with breakfast., Disp: , Rfl:  ?  nortriptyline (PAMELOR) 10 MG capsule, Take 10 mg by mouth at bedtime., Disp: , Rfl:  ?  valACYclovir (VALTREX) 500 MG tablet, Take 500 mg by mouth daily., Disp: , Rfl:  ? ?Review of Systems: ?Denies appetite changes, fevers, chills, fatigue, unexplained weight changes. ?Denies hearing loss, neck lumps or masses, mouth sores, ringing in ears or voice changes. ?Denies cough or wheezing.  Denies shortness of breath. ?Denies chest pain or palpitations. Denies leg swelling. ?Denies abdominal distention, pain, blood in stools, constipation, diarrhea, nausea, vomiting, or early satiety. ?Denies pain with intercourse, dysuria, frequency, hematuria or incontinence. ?Denies hot flashes, pelvic pain, vaginal bleeding or vaginal discharge.   ?Denies joint pain, back pain or muscle pain/cramps. ?Denies itching, rash, or wounds. ?Denies dizziness, headaches, numbness or seizures. ?Denies swollen lymph nodes or glands, denies easy bruising or bleeding. ?Denies anxiety, depression, confusion, or decreased concentration. ? ?Physical Exam: ?BP 125/75 (BP Location: Left Arm, Patient Position: Sitting)   Pulse (!) 119   Temp (!) 97.1 ?F (36.2 ?C) (Tympanic)   Resp 16   Ht 5' 2.01" (1.575 m)   Wt 129 lb (58.5 kg)    SpO2 100%   BMI 23.59 kg/m?  ?On recheck at end of the visit, heart rate 102 ?General: Alert, oriented, no acute distress. ?HEENT: Normocephalic, atraumatic, sclera anicteric. ?Chest: Clear to auscultation bilaterally.  No wheezes or rhonchi. ?Cardiovascular: Mildly tachycardic with heart rate in the 100s, regular rhythm, no murmurs. ?Abdomen: soft, nontender.   ?Extremities: Grossly normal range of motion.  Warm, well perfused.  No edema bilaterally. ?GU: Normal appearing external genitalia without erythema, excoriation, or lesions.  Speculum exam reveals vaginal cuff intact, suture visible.  Bimanual exam reveals cuff intact, no fluctuance.   ? ?Laboratory & Radiologic Studies: ?None new ? ?Assessment & Plan: ?Krista Mcintyre is a 26 y.o. 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 ?  ?Patient is doing well from a postoperative standpoint meeting milestones.  Discussed very strict restrictions in terms of pelvic rest and heavy lifting.  Reviewed postoperative expectations. ? ?I will plan to see her back in approximately 5 weeks before releasing her. ? ?We discussed her borderline tachycardia on multiple visits.  She is asymptomatic, but frequently has a heart rate in the 90-110  range, somewhat atypical for someone as young, thin, and healthy as she is.  She has a visit with her primary care doctor within the next couple of weeks and I have encouraged her to talk to her PCP about this. ? ?Patient was excepted to graduate school at [employer] in [place].  She will be moving there over the summer.  At her next visit, we will discuss further and ultimately send a referral to somebody in [place] for her to transition care after she moves. ? ?24 minutes of  total time was spent for this patient encounter, including preparation, face-to-face counseling with the patient and coordination of care, and documentation of the encounter. ? ?Jeral Pinch, MD  ?Division of

## 2022-02-01 NOTE — Patient Instructions (Signed)
It was good to see you today.  Things appear to be healing well.  I will see you back in 1 month for final exam of your vaginal cuff.  Remember, no heavy lifting and pelvic rest until that time. ?

## 2022-02-04 ENCOUNTER — Encounter: Payer: Self-pay | Admitting: Gynecologic Oncology

## 2022-02-05 ENCOUNTER — Encounter: Payer: Self-pay | Admitting: Gynecologic Oncology

## 2022-02-27 ENCOUNTER — Encounter: Payer: Self-pay | Admitting: Gynecologic Oncology

## 2022-03-08 ENCOUNTER — Other Ambulatory Visit: Payer: Self-pay

## 2022-03-08 ENCOUNTER — Encounter: Payer: Self-pay | Admitting: Gynecologic Oncology

## 2022-03-08 ENCOUNTER — Inpatient Hospital Stay: Payer: 59 | Attending: Gynecologic Oncology | Admitting: Gynecologic Oncology

## 2022-03-08 VITALS — BP 124/70 | HR 99 | Temp 98.6°F | Resp 16 | Ht 62.0 in | Wt 135.5 lb

## 2022-03-08 DIAGNOSIS — T8131XD Disruption of external operation (surgical) wound, not elsewhere classified, subsequent encounter: Secondary | ICD-10-CM

## 2022-03-08 DIAGNOSIS — Z7189 Other specified counseling: Secondary | ICD-10-CM

## 2022-03-08 DIAGNOSIS — Z90722 Acquired absence of ovaries, bilateral: Secondary | ICD-10-CM

## 2022-03-08 DIAGNOSIS — C569 Malignant neoplasm of unspecified ovary: Secondary | ICD-10-CM

## 2022-03-08 DIAGNOSIS — Z90710 Acquired absence of both cervix and uterus: Secondary | ICD-10-CM

## 2022-03-08 NOTE — Patient Instructions (Addendum)
It was good to see you today.  The top of the vagina appears to be healing well.  I would continue with pelvic rest for another 4 weeks.  You may notice over the next couple of months some suture come out of the vagina.  There are a couple stitches that I still see in place today.  We will plan to send your records to [facility].  Your next follow-up visit will be in approximately 6 months.  If you have any concerning symptoms as we discussed today, please call to see somebody sooner.

## 2022-03-08 NOTE — Progress Notes (Signed)
Gynecologic Oncology Return Clinic Visit  03/08/22  Reason for Visit: follow-up after surgery  Treatment History: Early 2021:  initially seen by the [facility] service at [facility] for chronic pelvic pain. Management with continuous OCP was unhelpful as was duloxetine. Endometriosis was suspected and she was consented for laparoscopic fulguration and IUD placement.  12/03/2019 underwent operative laparoscopy for excision of endometriosis and placement of mIUD with MIS, biopsy of adhesion and pelvic side wall implant. Findings from surgery - "3cm area over the right pelvic sidewall with a vesicular plaque, visibly suspicious for endometriosis, just medial to the right ureter. 1cm area of similar appearing plaque over the left ureter. Bilateral ureters visualized retroperitoneally and lateralized from the operative sites. Otherwise normal posterior cul de sac. All visible disease excised."  Final path returned serous borderline tumor.  Referred to gyn oncology. 01/11/2020 case discussed in tumor board recommendation with CT abdomen pelvis and diagnostic laparoscopy with washing and peritoneal biopsy with omentectomy was recommended. 01/19/2020 CT scan revealed no visible disease or enlarged lymph nodes. 02/2020: Reoperation with Dr. Cecil Cranker ([facility]) for pelvic washings, omentectomy, and peritoneal stripping with biopsies. Cytology was positive for neoplastic cells but all other biopsies were negative.  04/2020: seen at [facility] for follow-up. Moved to [place], [place] 07/2020: Established care at [facility]. CA-125 was 8. 06/19/2021: CA- 125 was 15.6. 06/19/2021: Pelvic ultrasound exam showed normal sonographic appearance of the ovaries, no masses or free fluid.  Normal uterus and endometrium, IUD in correct position. 11/21/2021: Total robotic hysterectomy with bilateral salpingectomy, oversew of bladder peritoneum.  Final pathology revealed inactive endometrium with progesterone effect.  Cervix with low-grade squamous  intraepithelial lesion, no malignancy.  Bilateral fallopian tubes normal in appearance.  Peritoneal nodule biopsy with stromal fibrosis, no malignancy.  Washings showed atypical mesothelial cells.   Patient was seen postoperatively on 01/02/2022, noted to have vaginal cuff dehiscence in the setting of heavy lifting at work.  On 3/23, the patient underwent exam under anesthesia and vaginal cuff repair.  Interval History: Patient reports doing well.  She denies any vaginal bleeding or discharge.  Reports attempting to have gentle intercourse this past Monday night.  Noticed some tightness and discomfort, denies any pain.  Has not been doing any heavy lifting but has been doing some medium lifting.  She leaves next week to travel to [place] and then is going to [place] for vacation.  After she gets back from her vacation, she will moved to [place] where she is starting graduate school in the fall.  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     Past Surgical History:  Procedure Laterality Date   GUM SURGERY     Gum grafting   LAPAROSCOPIC ENDOMETRIOSIS FULGURATION  11/2019   LAPAROSCOPY  02/2020   omentectomy, peritoneal biopsies   ROBOTIC ASSISTED LAPAROSCOPIC HYSTERECTOMY AND SALPINGECTOMY N/A 11/21/2021   Procedure: XI ROBOTIC ASSISTED LAPAROSCOPIC HYSTERECTOMY AND Bilateral SALPINGECTOMY;  Surgeon: Lafonda Mosses, MD;  Location: [facility];  Service: Gynecology;  Laterality: N/A;    Family History  Problem Relation Age of Onset   Breast cancer Maternal Grandmother    Kidney cancer Maternal Grandfather    Cervical cancer Paternal Grandmother     Social History   Socioeconomic  History   Marital status: Single    Spouse name: Not on file   Number of children:  Not on file   Years of education: Not on file   Highest education level: Not on file  Occupational History   Not on file  Tobacco Use   Smoking status: Never   Smokeless tobacco: Never  Vaping Use   Vaping Use: Never used  Substance and Sexual Activity   Alcohol use: Yes   Drug use: Never   Sexual activity: Not on file  Other Topics Concern   Not on file  Social History Narrative   Not on file   Social Determinants of Health   Financial Resource Strain: Not on file  Food Insecurity: Not on file  Transportation Needs: Not on file  Physical Activity: Not on file  Stress: Not on file  Social Connections: Not on file    Current Medications:  Current Outpatient Medications:    buPROPion (WELLBUTRIN XL) 150 MG 24 hr tablet, Take 150 mg by mouth every morning., Disp: , Rfl:    Cholecalciferol 125 MCG (5000 UT) TABS, Take 5,000 Units by mouth daily., Disp: , Rfl:    ferrous sulfate 325 (65 FE) MG tablet, Take 325 mg by mouth daily with breakfast., Disp: , Rfl:    nortriptyline (PAMELOR) 10 MG capsule, Take 10 mg by mouth at bedtime., Disp: , Rfl:    valACYclovir (VALTREX) 500 MG tablet, Take 500 mg by mouth daily. (Patient not taking: Reported on 02/27/2022), Disp: , Rfl:   Review of Systems: Denies appetite changes, fevers, chills, fatigue, unexplained weight changes. Denies hearing loss, neck lumps or masses, mouth sores, ringing in ears or voice changes. Denies cough or wheezing.  Denies shortness of breath. Denies chest pain or palpitations. Denies leg swelling. Denies abdominal distention, pain, blood in stools, constipation, diarrhea, nausea, vomiting, or early satiety. Denies pain with intercourse, dysuria, frequency, hematuria or incontinence. Denies hot flashes, pelvic pain, vaginal bleeding or vaginal discharge.   Denies joint pain, back pain or muscle pain/cramps. Denies itching, rash, or wounds. Denies dizziness, headaches, numbness or seizures. Denies swollen  lymph nodes or glands, denies easy bruising or bleeding. Denies anxiety, depression, confusion, or decreased concentration.  Physical Exam: BP 124/70 (BP Location: Left Arm, Patient Position: Sitting)   Pulse 99   Temp 98.6 F (37 C) (Oral)   Resp 16   Ht '5\' 2"'$  (1.575 m)   Wt 135 lb 8 oz (61.5 kg)   SpO2 100%   BMI 24.78 kg/m  General: Alert, oriented, no acute distress. HEENT: Normocephalic, atraumatic, sclera anicteric. Chest: Unlabored breathing on room air. Abdomen: soft, nontender.  Normoactive bowel sounds.  No masses or hepatosplenomegaly appreciated.  Well-healed incisions. Extremities: Grossly normal range of motion.  Warm, well perfused.  No edema bilaterally. Skin: No rashes or lesions noted. GU: Normal appearing external genitalia without erythema, excoriation, or lesions.  Speculum exam reveals no bleeding or discharge.  Cuff is intact with the exception of a small, less than 5 mm, area of cuff separation where there appears to be granulation tissue just posterior to the vaginal cuff tissue.  There are several stitches of Prolene still in place.  Bimanual exam reveals cuff intact, no palpable defects.    Laboratory & Radiologic Studies: none  Assessment & Plan: Krista Mcintyre is a 26 y.o. 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 Both ovaries still in situ.   The patient is doing well.  Her cuff is intact on bimanual exam.  There is an area that appears to maybe have separated slightly since I last saw her but there is good granulation tissue just behind it.  We discussed another 4 weeks of pelvic rest.  We also discussed that she will likely pass the remaining suture in the next month or 2.   Patient was  excepted to graduate school at [employer] in [place].  She will be moving there after traveling this summer.  She starts school in August.  We will send a referral to [facility] at [place].  The plan that we had previously discussed was visit with an exam, ultrasound +/- CA-125 (unknown if a tumor marker for her as she did not have it drawn until well after surgery) every 6 months until completion surgery.  Recommend delaying completion surgery at least until age 63-40.   28 minutes of total time was spent for this patient encounter, including preparation, face-to-face counseling with the patient and coordination of care, and documentation of the encounter.  Jeral Pinch, MD  Division of Gynecologic Oncology  Department of Obstetrics and Gynecology  [facility]

## 2022-03-12 ENCOUNTER — Telehealth: Payer: Self-pay

## 2022-03-12 NOTE — Telephone Encounter (Signed)
Patient relocating to [place], [place]. Referral successfully faxed to [facility] [phone]).

## 2022-06-18 ENCOUNTER — Telehealth: Payer: Self-pay | Admitting: *Deleted

## 2022-06-18 ENCOUNTER — Encounter: Payer: Self-pay | Admitting: Gynecologic Oncology

## 2022-06-18 NOTE — Telephone Encounter (Signed)
Per patient request fax records to [facility] location for [facility]

## 2022-07-31 ENCOUNTER — Telehealth: Payer: Self-pay

## 2022-07-31 NOTE — Telephone Encounter (Signed)
Sharyn Lull from [facility] called requesting additional records from 06/19/21-12/12/21. Our office referred pt and she sees Dr. Josph Macho.  Records faxed and confirmed to fax# [phone]

## 2023-05-11 IMAGING — US US PELVIS COMPLETE WITH TRANSVAGINAL
1 series · 15 of 25 positions shown · non-contrast
Comparison: None available.

CLINICAL DATA: Initial evaluation for history of unstable serous
borderline tumor of unknown primary, evaluate ovaries.



[Series 1: us pelvis complete mc & wl · 15 of 56 slices shown]
[im 1/56]
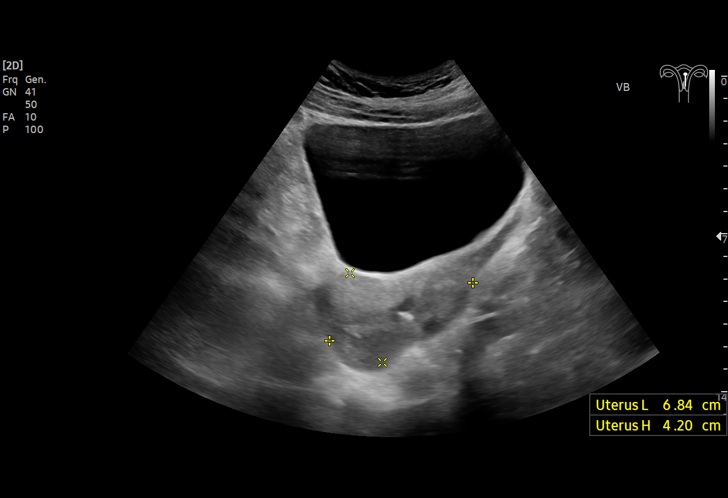
[im 5/56]
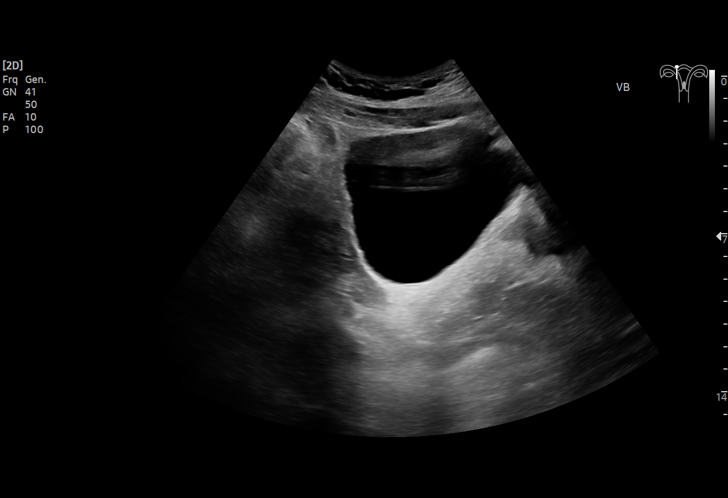
[im 10/56]
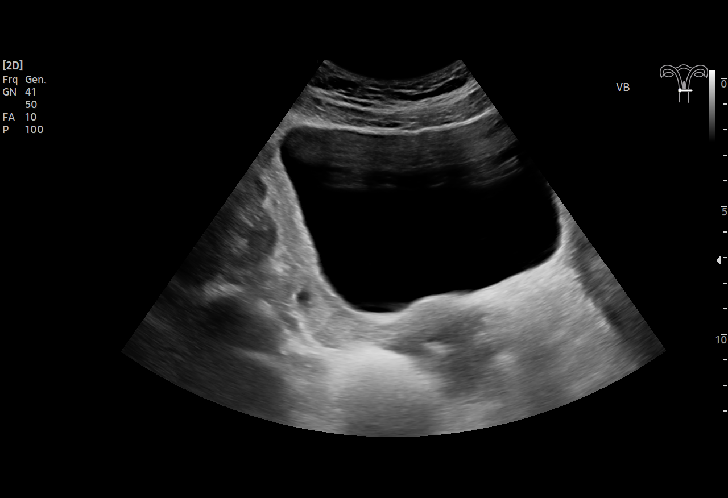
[im 12/56]
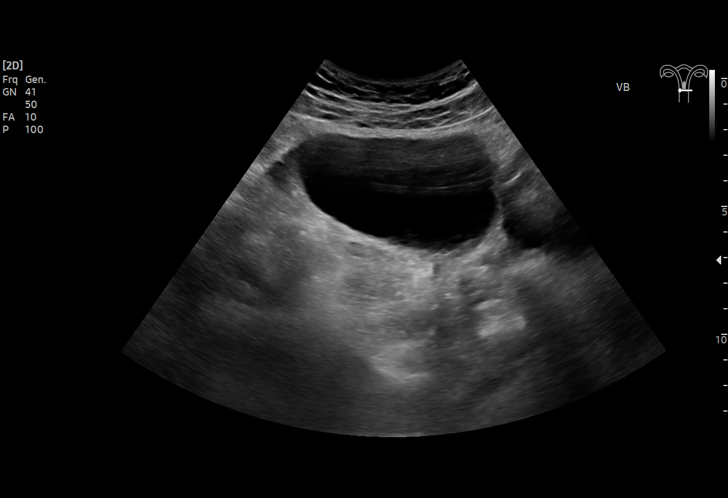
[im 17/56]
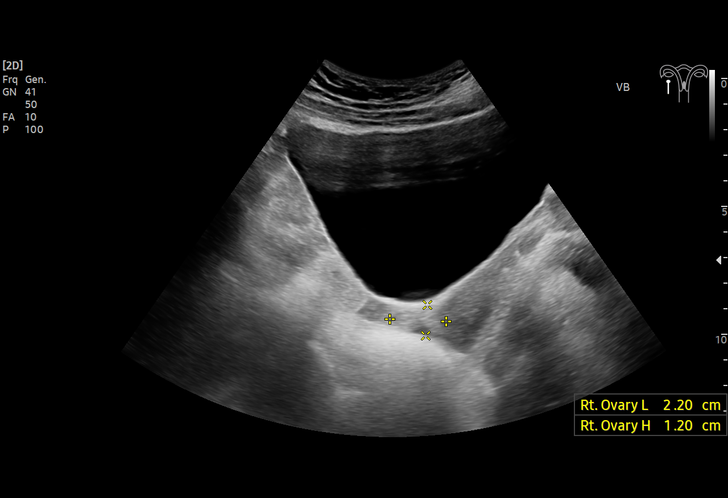
[im 21/56]
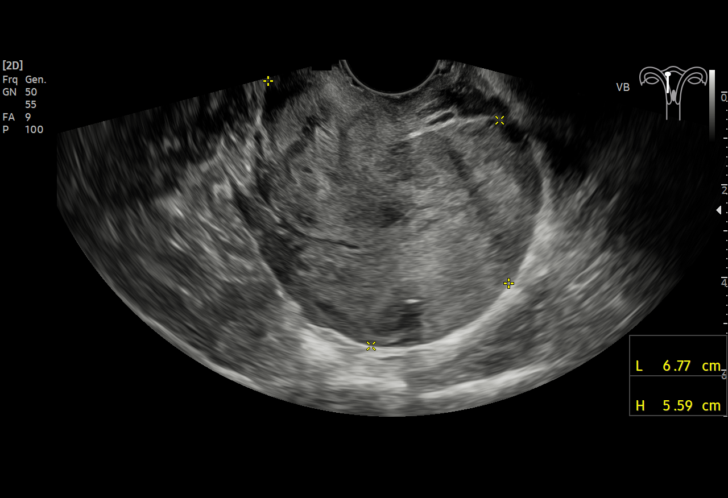
[im 23/56]
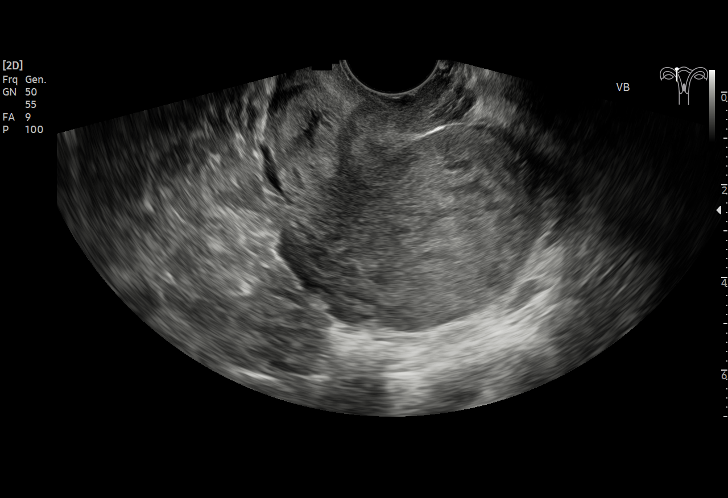
[im 28/56]
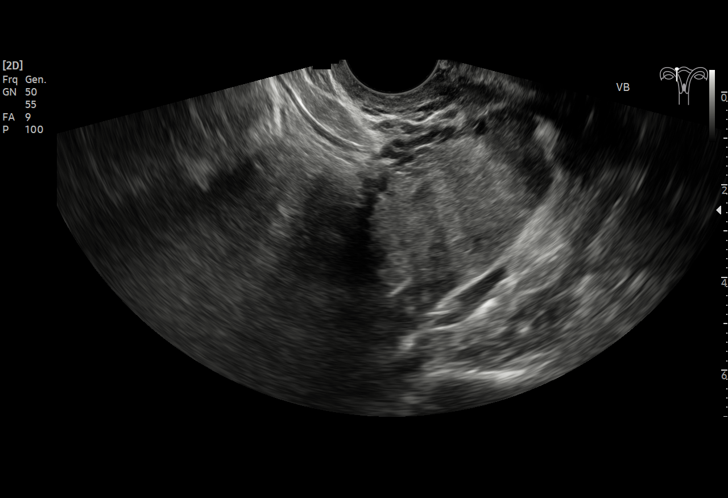
[im 33/56]
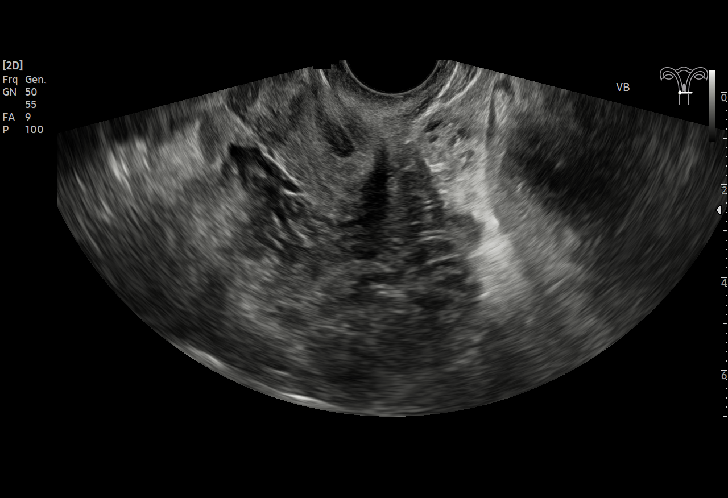
[im 35/56]
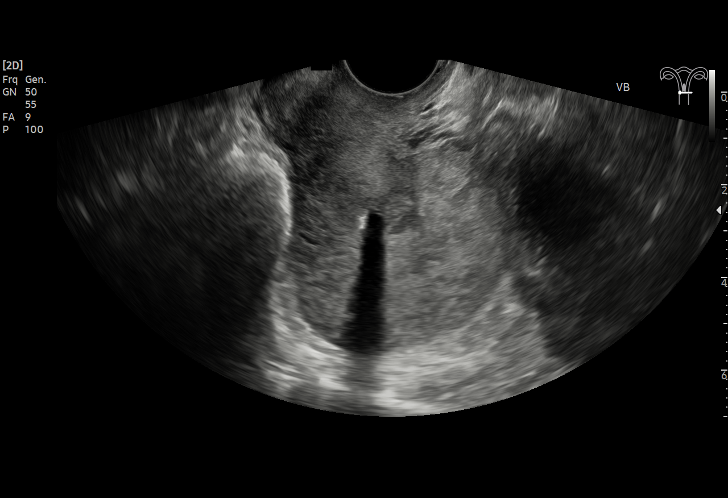
[im 39/56]
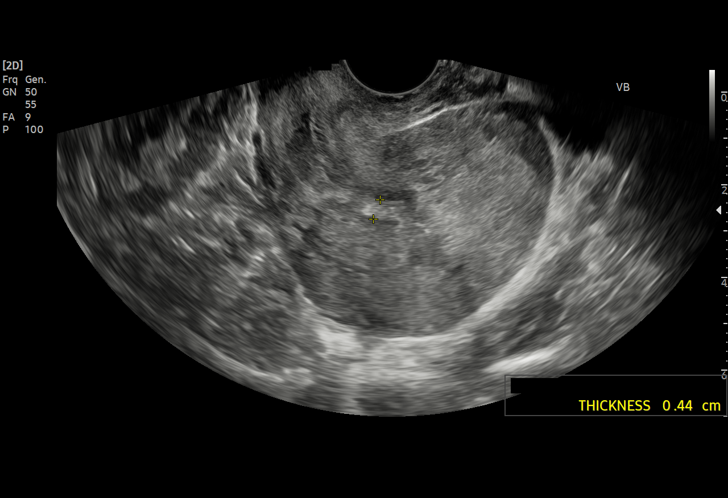
[im 44/56]
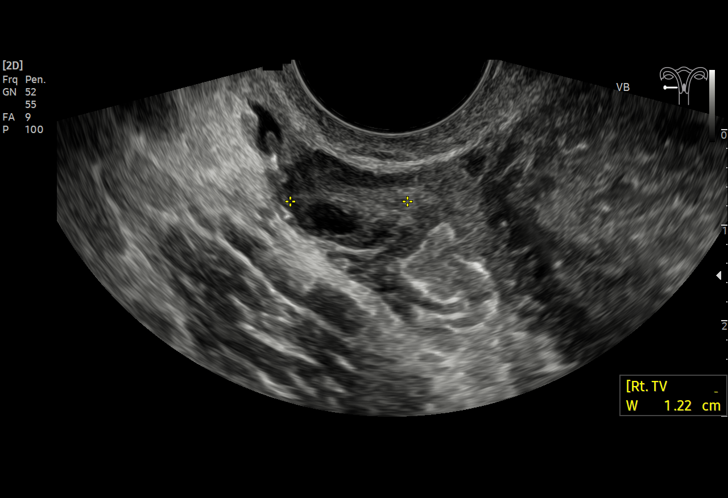
[im 46/56]
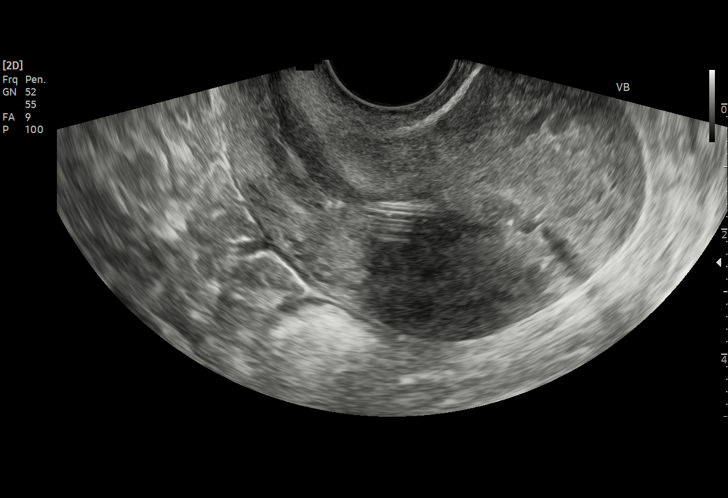
[im 51/56]
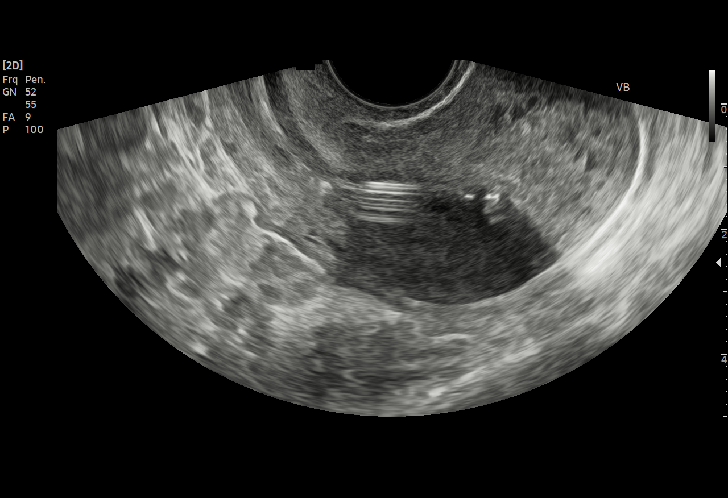
[im 56/56]
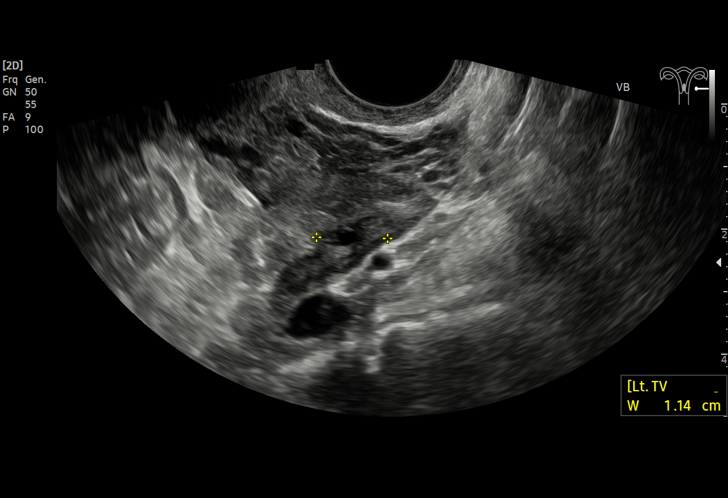

[15 of 25 positions shown; findings below may reference images not displayed]

FINDINGS: Uterus

Measurements: 6.8 x 5.6 x 5.6 cm = volume: 109.8 mL. Uterus is
retroverted. No discrete fibroid or other myometrial abnormality.

Endometrium

Thickness: 4.4 mm. No focal abnormality visualized. IUD in
appropriate position within the endometrial cavity.

Right ovary

Measurements: 2.7 x 1.0 x 1.2 cm = volume: 1.7 mL. Normal
appearance/no adnexal mass.

Left ovary

Measurements: 1.6 x 0.9 x 1.1 cm = volume: 0.8 mL. Normal
appearance/no adnexal mass.

Other findings

No abnormal free fluid.
IMPRESSION: 1. Normal sonographic appearance of the ovaries. No adnexal mass or
free fluid.
2. Normal uterus and endometrium.
3. IUD in appropriate position within the endometrial cavity.

## 2023-10-28 IMAGING — CT CT ABD-PELV W/ CM
2 of 4 series · 15 of 46 positions shown, 17 images · IV contrast (APPLIED)
Comparison: None.

CLINICAL DATA: Worsening postoperative abdominal pain status post
robotic hysterectomy. History of serous borderline tumor.

EXAM:
CT ABDOMEN AND PELVIS WITH CONTRAST
TECHNIQUE: Multidetector CT imaging of the abdomen and pelvis was performed
using the standard protocol following bolus administration of
intravenous contrast.

[Series 2: axial st · axial · 0.74mm/px · z∈[-717,-292]mm · 12 of 97 slices shown, 14 images]
[im 6/97  soft-tissue]
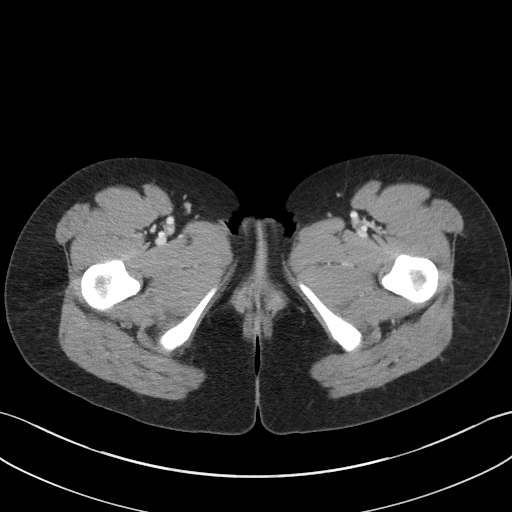
[im 6/97  bone]
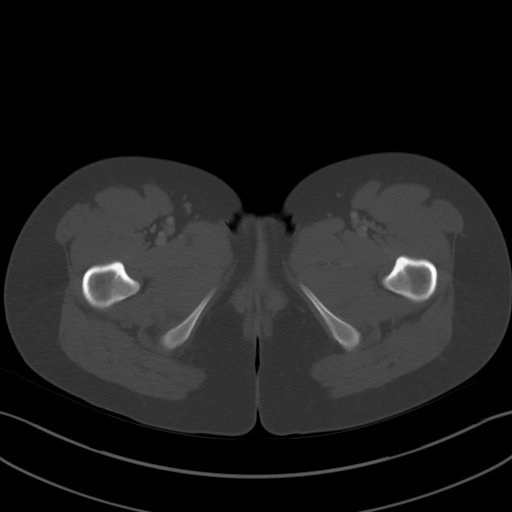
[im 16/97  soft-tissue]
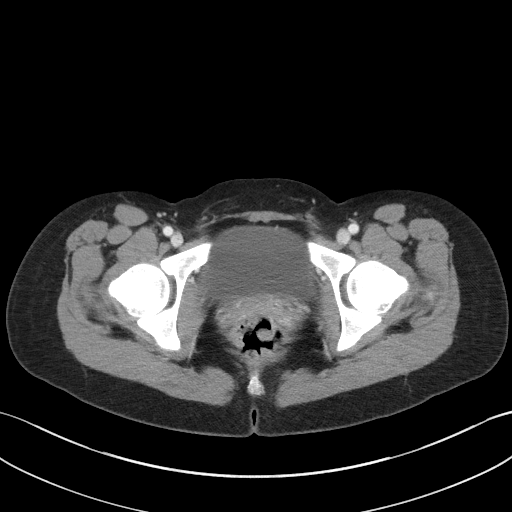
[im 21/97  soft-tissue]
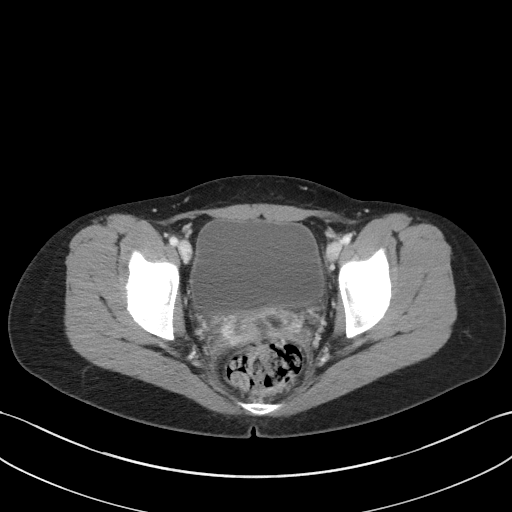
[im 31/97  soft-tissue]
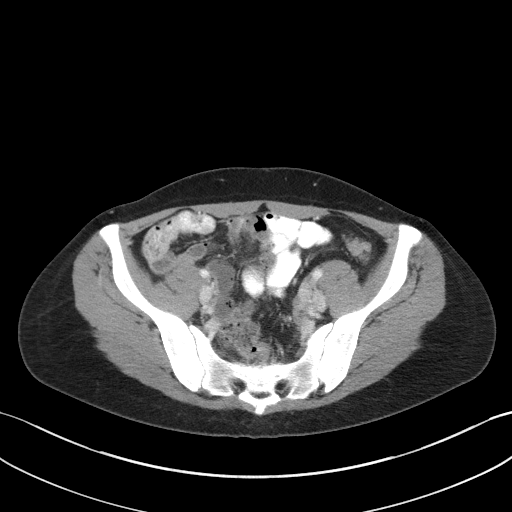
[im 36/97  soft-tissue]
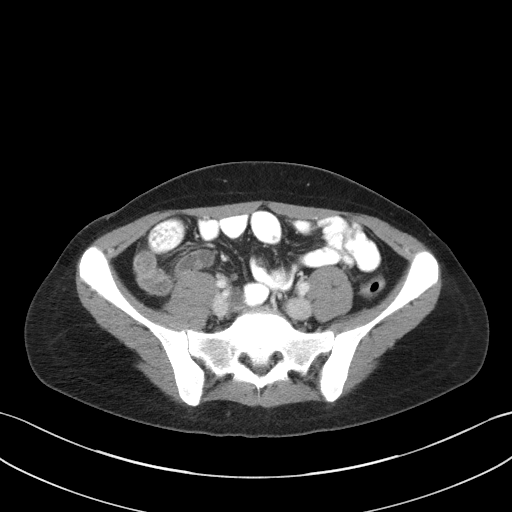
[im 46/97  soft-tissue]
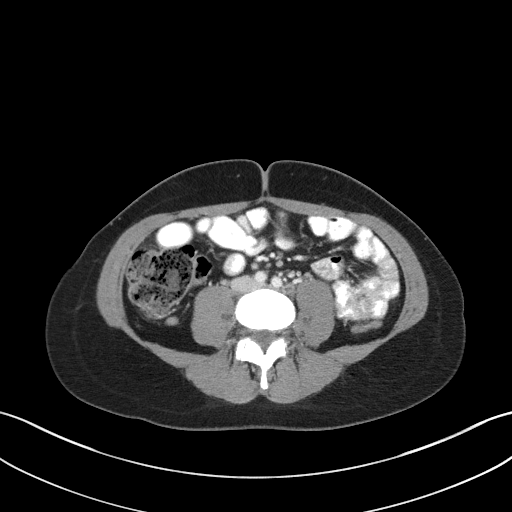
[im 51/97  soft-tissue]
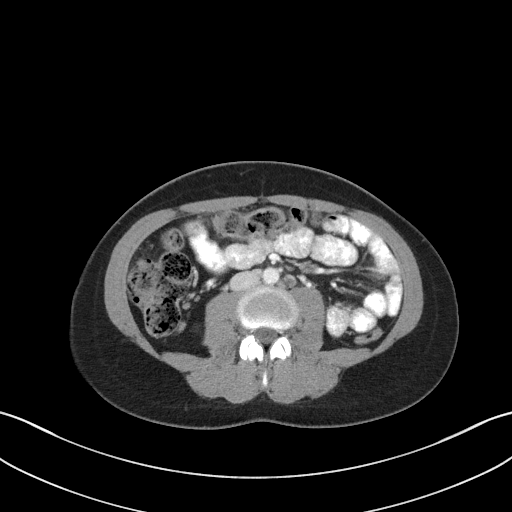
[im 61/97  soft-tissue]
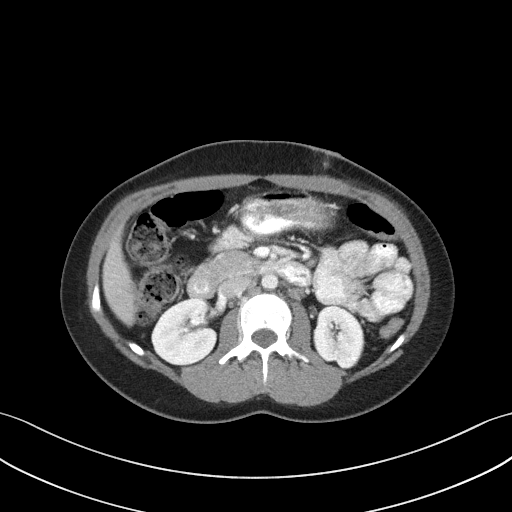
[im 66/97  soft-tissue]
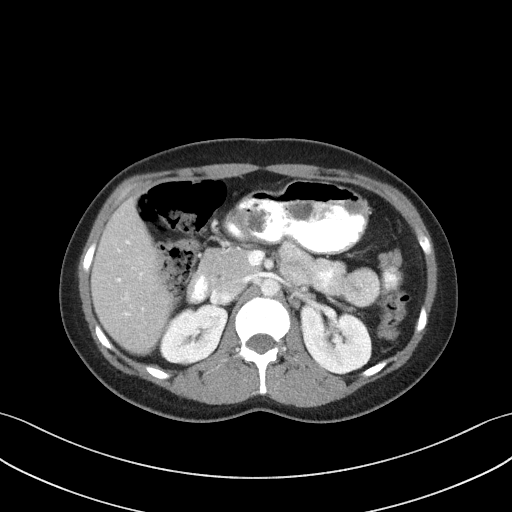
[im 66/97  bone]
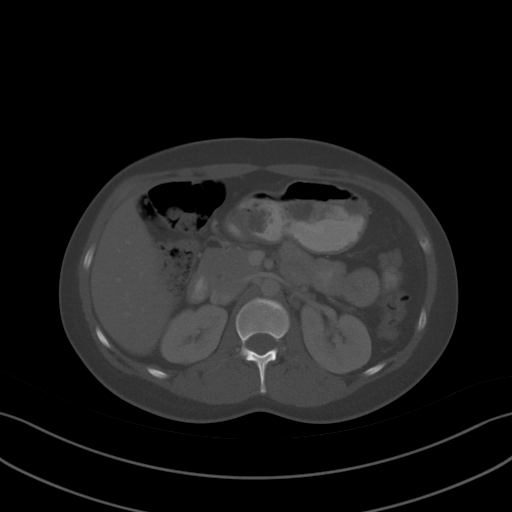
[im 76/97  soft-tissue]
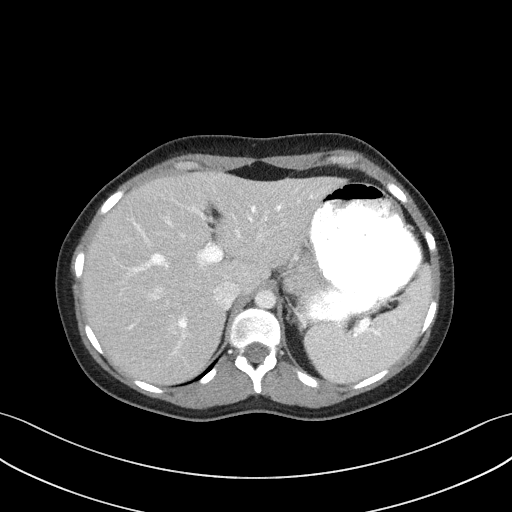
[im 81/97  soft-tissue]
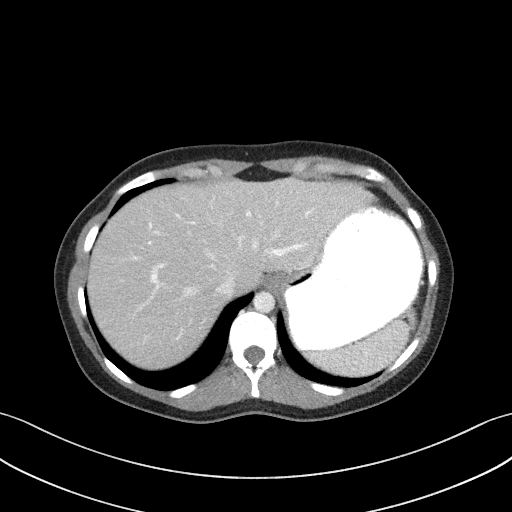
[im 91/97  soft-tissue]
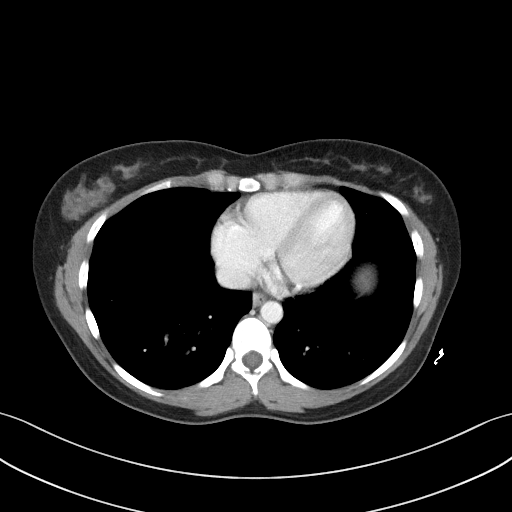

[Series 6: coronal st · coronal · 0.62mm/px · 3 of 77 slices shown]
[im 26/77  soft-tissue]
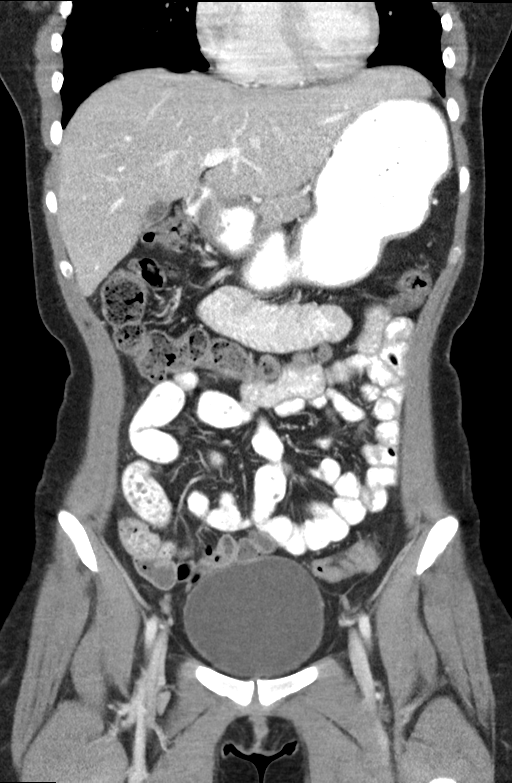
[im 34/77  soft-tissue]
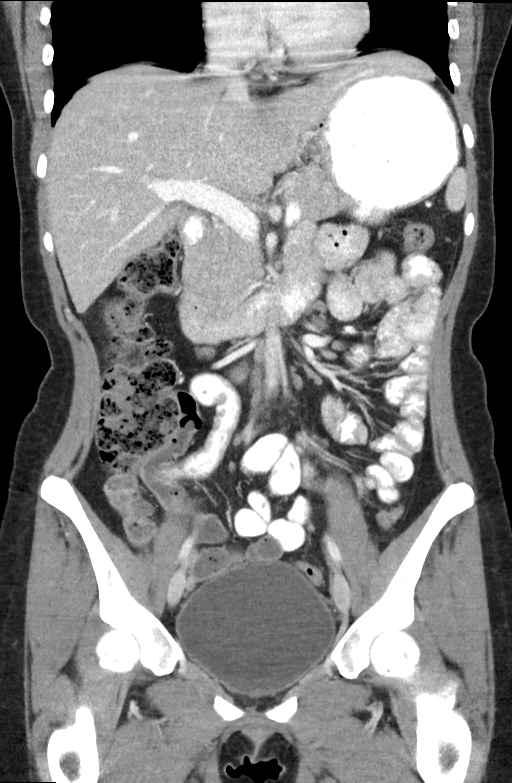
[im 43/77  soft-tissue]
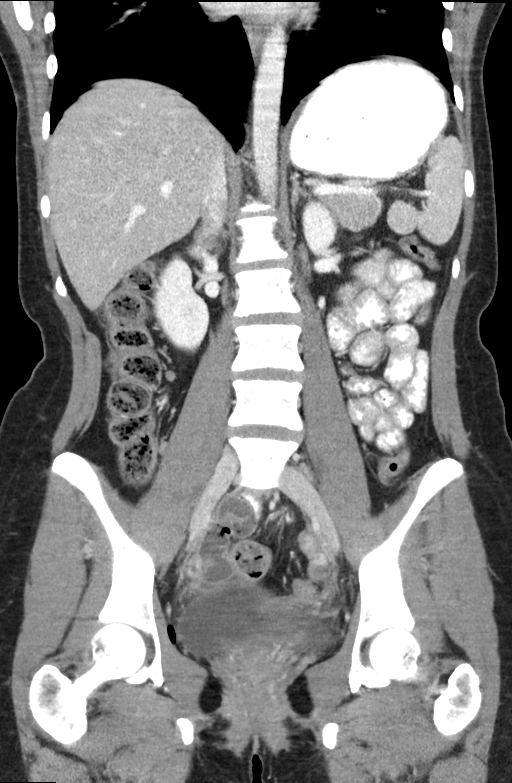

[15 of 46 positions shown; findings below may reference images not displayed]

RADIATION DOSE REDUCTION: This exam was performed according to the
departmental dose-optimization program which includes automated
exposure control, adjustment of the mA and/or kV according to
patient size and/or use of iterative reconstruction technique.

CONTRAST:  100mL OMNIPAQUE IOHEXOL 300 MG/ML  SOLN
FINDINGS: Lower chest: No acute abnormality.

Hepatobiliary: No suspicious hepatic lesion. Gallbladder is
unremarkable. No biliary ductal dilation.

Pancreas: No pancreatic ductal dilation or evidence of acute
inflammation.

Spleen: Normal size spleen without focal splenic lesion

Adrenals/Urinary Tract: Bilateral adrenal glands appear normal. No
hydronephrosis. Symmetric bilateral renal enhancement. Urinary
bladder is unremarkable for degree of distension.

Stomach/Bowel: Radiopaque enteric contrast material traverses distal
loops of small bowel. Stomach is unremarkable for degree of
distension. No pathologic dilation of small or large bowel. The
appendix and terminal ileum appear normal. Moderate to large volume
of stool in the proximal colon and rectum. Descending colon and
sigmoid colon are predominantly decompressed. No evidence of acute
bowel inflammation.

Vascular/Lymphatic: Normal caliber abdominal aorta. No
pathologically enlarged abdominal or pelvic lymph nodes.

Reproductive: Surgical changes hysterectomy. Crenulated cystic
structure in the right ovary likely reflects an involuting corpus
luteal cyst. Left ovary appears normal.

Other: Trace pelvic free fluid is within physiologic normal limits.
No pneumoperitoneum. No walled off fluid collections.

Musculoskeletal: No acute or significant osseous findings.
IMPRESSION: 1. No acute abdominopelvic findings.
2. Moderate to large volume of stool in the proximal colon and
rectum. Correlate for constipation.
3. Crenulated cystic structure in the right ovary likely reflects an
involuting corpus luteal cyst.
4. Trace pelvic free fluid within physiologic normal limits.
